# Patient Record
Sex: Female | Born: 1940 | Race: White | Hispanic: No | Marital: Married | Smoking: Never smoker
Health system: Southern US, Community
[De-identification: ages and names within clinical notes are randomized; demographics above are authoritative.]

## PROBLEM LIST (undated history)

## (undated) DIAGNOSIS — E785 Hyperlipidemia, unspecified: Secondary | ICD-10-CM

## (undated) DIAGNOSIS — I10 Essential (primary) hypertension: Secondary | ICD-10-CM

## (undated) DIAGNOSIS — E119 Type 2 diabetes mellitus without complications: Secondary | ICD-10-CM

## (undated) HISTORY — DX: Hyperlipidemia, unspecified: E78.5

## (undated) HISTORY — DX: Essential (primary) hypertension: I10

## (undated) HISTORY — DX: Type 2 diabetes mellitus without complications: E11.9

---

## 2010-08-05 ENCOUNTER — Inpatient Hospital Stay (HOSPITAL_COMMUNITY)
Admission: EM | Admit: 2010-08-05 | Discharge: 2010-08-08 | DRG: 639 | Disposition: A | Discharge status: Home / Self Care | Payer: Medicare Other | Attending: Nephrology | Admitting: Nephrology

## 2010-08-05 ENCOUNTER — Inpatient Hospital Stay (HOSPITAL_COMMUNITY): Payer: Medicare Other

## 2010-08-05 DIAGNOSIS — I10 Essential (primary) hypertension: Secondary | ICD-10-CM | POA: Diagnosis present

## 2010-08-05 DIAGNOSIS — Z833 Family history of diabetes mellitus: Secondary | ICD-10-CM

## 2010-08-05 DIAGNOSIS — E1310 Other specified diabetes mellitus with ketoacidosis without coma: Principal | ICD-10-CM | POA: Diagnosis present

## 2010-08-05 LAB — BASIC METABOLIC PANEL WITH GFR
BUN: 29 mg/dL — ABNORMAL HIGH (ref 6–23)
BUN: 21 mg/dL (ref 6–23)
BUN: 18 mg/dL (ref 6–23)
CO2: 20 meq/L (ref 19–32)
CO2: 27 meq/L (ref 19–32)
CO2: 28 meq/L (ref 19–32)
Calcium: 10.6 mg/dL — ABNORMAL HIGH (ref 8.4–10.5)
Calcium: 9 mg/dL (ref 8.4–10.5)
Calcium: 8.8 mg/dL (ref 8.4–10.5)
Chloride: 84 meq/L — ABNORMAL LOW (ref 96–112)
Chloride: 102 meq/L (ref 96–112)
Chloride: 102 meq/L (ref 96–112)
Creatinine, Ser: 1.27 mg/dL — ABNORMAL HIGH (ref 0.50–1.10)
Creatinine, Ser: 0.88 mg/dL (ref 0.50–1.10)
Creatinine, Ser: 0.84 mg/dL (ref 0.50–1.10)
GFR calc Af Amer: 51 mL/min — ABNORMAL LOW (ref >60)
GFR calc Af Amer: >60 mL/min (ref >60)
GFR calc Af Amer: >60 mL/min (ref >60)
GFR calc non Af Amer: 42 mL/min — ABNORMAL LOW (ref >60)
GFR calc non Af Amer: >60 mL/min (ref >60)
GFR calc non Af Amer: >60 mL/min (ref >60)
Glucose, Bld: 765 mg/dL (ref 70–99)
Glucose, Bld: 253 mg/dL — ABNORMAL HIGH (ref 70–99)
Glucose, Bld: 143 mg/dL — ABNORMAL HIGH (ref 70–99)
Potassium: 4.1 meq/L (ref 3.5–5.1)
Potassium: 3.4 meq/L — ABNORMAL LOW (ref 3.5–5.1)
Potassium: 3.1 meq/L — ABNORMAL LOW (ref 3.5–5.1)
Sodium: 132 meq/L — ABNORMAL LOW (ref 135–145)
Sodium: 140 meq/L (ref 135–145)
Sodium: 139 meq/L (ref 135–145)

## 2010-08-05 LAB — HEPATIC FUNCTION PANEL
ALT: 45 U/L — ABNORMAL HIGH (ref 0–35)
AST: 23 U/L (ref 0–37)
Albumin: 5 g/dL (ref 3.5–5.2)
Alkaline Phosphatase: 129 U/L — ABNORMAL HIGH (ref 39–117)
Bilirubin, Direct: 0.2 mg/dL (ref 0.0–0.3)
Indirect Bilirubin: 0.5 mg/dL (ref 0.3–0.9)
Total Bilirubin: 0.7 mg/dL (ref 0.3–1.2)
Total Protein: 9.7 g/dL — ABNORMAL HIGH (ref 6.0–8.3)

## 2010-08-05 LAB — CBC
HCT: 49.1 % — ABNORMAL HIGH (ref 36.0–46.0)
Hemoglobin: 17.2 g/dL — ABNORMAL HIGH (ref 12.0–15.0)
MCH: 31.4 pg (ref 26.0–34.0)
MCHC: 35 g/dL (ref 30.0–36.0)
MCV: 89.8 fL (ref 78.0–100.0)
Platelets: 207 K/uL (ref 150–400)
RBC: 5.47 MIL/uL — ABNORMAL HIGH (ref 3.87–5.11)
RDW: 12.3 % (ref 11.5–15.5)
WBC: 7.6 K/uL (ref 4.0–10.5)

## 2010-08-05 LAB — GLUCOSE, CAPILLARY
Glucose-Capillary: >600 mg/dL (ref 70–99)
Glucose-Capillary: 368 mg/dL — ABNORMAL HIGH (ref 70–99)
Glucose-Capillary: 256 mg/dL — ABNORMAL HIGH (ref 70–99)
Glucose-Capillary: 324 mg/dL — ABNORMAL HIGH (ref 70–99)
Glucose-Capillary: 301 mg/dL — ABNORMAL HIGH (ref 70–99)
Glucose-Capillary: 180 mg/dL — ABNORMAL HIGH (ref 70–99)
Glucose-Capillary: 326 mg/dL — ABNORMAL HIGH (ref 70–99)

## 2010-08-05 LAB — DIFFERENTIAL
Basophils Absolute: 0 K/uL (ref 0.0–0.1)
Basophils Relative: 0 % (ref 0–1)
Eosinophils Absolute: 0 K/uL (ref 0.0–0.7)
Eosinophils Relative: 0 % (ref 0–5)
Lymphocytes Relative: 15 % (ref 12–46)
Lymphs Abs: 1.2 K/uL (ref 0.7–4.0)
Monocytes Absolute: 0.2 K/uL (ref 0.1–1.0)
Monocytes Relative: 2 % — ABNORMAL LOW (ref 3–12)
Neutro Abs: 6.2 K/uL (ref 1.7–7.7)
Neutrophils Relative %: 82 % — ABNORMAL HIGH (ref 43–77)

## 2010-08-06 LAB — GLUCOSE, CAPILLARY
Glucose-Capillary: 257 mg/dL — ABNORMAL HIGH (ref 70–99)
Glucose-Capillary: 273 mg/dL — ABNORMAL HIGH (ref 70–99)
Glucose-Capillary: 310 mg/dL — ABNORMAL HIGH (ref 70–99)
Glucose-Capillary: 374 mg/dL — ABNORMAL HIGH (ref 70–99)

## 2010-08-06 LAB — BASIC METABOLIC PANEL
BUN: 16 mg/dL (ref 6–23)
CO2: 24 mEq/L (ref 19–32)
CO2: 22 mEq/L (ref 19–32)
CO2: 24 mEq/L (ref 19–32)
Chloride: 102 mEq/L (ref 96–112)
Chloride: 103 mEq/L (ref 96–112)
Chloride: 102 mEq/L (ref 96–112)
Creatinine, Ser: 0.84 mg/dL (ref 0.50–1.10)
Creatinine, Ser: 0.79 mg/dL (ref 0.50–1.10)
GFR calc Af Amer: >60 mL/min (ref >60)
GFR calc Af Amer: >60 mL/min (ref >60)
GFR calc non Af Amer: >60 mL/min (ref >60)
Glucose, Bld: 328 mg/dL — ABNORMAL HIGH (ref 70–99)
Glucose, Bld: 391 mg/dL — ABNORMAL HIGH (ref 70–99)
Potassium: 3.2 mEq/L — ABNORMAL LOW (ref 3.5–5.1)
Potassium: 4 mEq/L (ref 3.5–5.1)
Potassium: 3.9 mEq/L (ref 3.5–5.1)
Potassium: 3.6 mEq/L (ref 3.5–5.1)
Sodium: 136 mEq/L (ref 135–145)
Sodium: 133 mEq/L — ABNORMAL LOW (ref 135–145)
Sodium: 134 mEq/L — ABNORMAL LOW (ref 135–145)

## 2010-08-06 LAB — CBC
HCT: 35 % — ABNORMAL LOW (ref 36.0–46.0)
Hemoglobin: 12.4 g/dL (ref 12.0–15.0)
MCV: 89.3 fL (ref 78.0–100.0)
RBC: 3.92 MIL/uL (ref 3.87–5.11)
WBC: 6.1 10*3/uL (ref 4.0–10.5)

## 2010-08-06 LAB — COMPREHENSIVE METABOLIC PANEL
Alkaline Phosphatase: 74 U/L (ref 39–117)
BUN: 16 mg/dL (ref 6–23)
CO2: 24 mEq/L (ref 19–32)
Chloride: 103 mEq/L (ref 96–112)
Creatinine, Ser: 0.73 mg/dL (ref 0.50–1.10)
GFR calc non Af Amer: >60 mL/min (ref >60)
Glucose, Bld: 292 mg/dL — ABNORMAL HIGH (ref 70–99)
Potassium: 3.7 mEq/L (ref 3.5–5.1)
Total Bilirubin: 0.5 mg/dL (ref 0.3–1.2)

## 2010-08-06 LAB — CLOSTRIDIUM DIFFICILE BY PCR: Toxigenic C. Difficile by PCR: NEGATIVE

## 2010-08-07 LAB — URINE CULTURE
Colony Count: >=100000
Culture  Setup Time: 201206292134

## 2010-08-07 LAB — CBC
HCT: 35.1 % — ABNORMAL LOW (ref 36.0–46.0)
Hemoglobin: 12.3 g/dL (ref 12.0–15.0)
MCH: 31.4 pg (ref 26.0–34.0)
MCHC: 35 g/dL (ref 30.0–36.0)
MCV: 89.5 fL (ref 78.0–100.0)
Platelets: 137 10*3/uL — ABNORMAL LOW (ref 150–400)
RBC: 3.92 MIL/uL (ref 3.87–5.11)
RDW: 12.4 % (ref 11.5–15.5)
WBC: 4.5 10*3/uL (ref 4.0–10.5)

## 2010-08-07 LAB — DIFFERENTIAL
Basophils Absolute: 0 10*3/uL (ref 0.0–0.1)
Basophils Relative: 0 % (ref 0–1)
Eosinophils Absolute: 0.1 10*3/uL (ref 0.0–0.7)
Eosinophils Relative: 2 % (ref 0–5)
Lymphocytes Relative: 54 % — ABNORMAL HIGH (ref 12–46)
Lymphs Abs: 2.4 10*3/uL (ref 0.7–4.0)
Monocytes Absolute: 0.2 10*3/uL (ref 0.1–1.0)
Monocytes Relative: 5 % (ref 3–12)
Neutro Abs: 1.7 10*3/uL (ref 1.7–7.7)
Neutrophils Relative %: 39 % — ABNORMAL LOW (ref 43–77)

## 2010-08-07 LAB — GLUCOSE, CAPILLARY
Glucose-Capillary: 264 mg/dL — ABNORMAL HIGH (ref 70–99)
Glucose-Capillary: 231 mg/dL — ABNORMAL HIGH (ref 70–99)
Glucose-Capillary: 247 mg/dL — ABNORMAL HIGH (ref 70–99)

## 2010-08-07 LAB — BASIC METABOLIC PANEL
BUN: 7 mg/dL (ref 6–23)
CO2: 23 mEq/L (ref 19–32)
Calcium: 8.4 mg/dL (ref 8.4–10.5)
Chloride: 105 mEq/L (ref 96–112)
Creatinine, Ser: 0.6 mg/dL (ref 0.50–1.10)
GFR calc Af Amer: >60 mL/min (ref >60)
GFR calc non Af Amer: >60 mL/min (ref >60)
Glucose, Bld: 264 mg/dL — ABNORMAL HIGH (ref 70–99)
Potassium: 3.2 mEq/L — ABNORMAL LOW (ref 3.5–5.1)
Sodium: 135 mEq/L (ref 135–145)

## 2010-08-08 LAB — FECAL LACTOFERRIN, QUANT: Fecal Lactoferrin: NEGATIVE

## 2010-08-08 LAB — CBC
HCT: 39 % (ref 36.0–46.0)
Hemoglobin: 13.7 g/dL (ref 12.0–15.0)
MCH: 30.9 pg (ref 26.0–34.0)
MCHC: 35.1 g/dL (ref 30.0–36.0)
MCV: 88 fL (ref 78.0–100.0)
Platelets: 153 10*3/uL (ref 150–400)
RBC: 4.43 MIL/uL (ref 3.87–5.11)
RDW: 12.3 % (ref 11.5–15.5)
WBC: 4.7 10*3/uL (ref 4.0–10.5)

## 2010-08-08 LAB — DIFFERENTIAL
Basophils Absolute: 0 10*3/uL (ref 0.0–0.1)
Basophils Relative: 0 % (ref 0–1)
Eosinophils Absolute: 0.1 10*3/uL (ref 0.0–0.7)
Eosinophils Relative: 3 % (ref 0–5)
Lymphocytes Relative: 48 % — ABNORMAL HIGH (ref 12–46)
Lymphs Abs: 2.3 10*3/uL (ref 0.7–4.0)
Monocytes Absolute: 0.3 10*3/uL (ref 0.1–1.0)
Monocytes Relative: 7 % (ref 3–12)
Neutro Abs: 2 10*3/uL (ref 1.7–7.7)
Neutrophils Relative %: 42 % — ABNORMAL LOW (ref 43–77)

## 2010-08-08 LAB — BASIC METABOLIC PANEL
BUN: 9 mg/dL (ref 6–23)
CO2: 23 mEq/L (ref 19–32)
Calcium: 10.1 mg/dL (ref 8.4–10.5)
Chloride: 99 mEq/L (ref 96–112)
Creatinine, Ser: 0.69 mg/dL (ref 0.50–1.10)
GFR calc Af Amer: >60 mL/min (ref >60)
GFR calc non Af Amer: >60 mL/min (ref >60)
Glucose, Bld: 296 mg/dL — ABNORMAL HIGH (ref 70–99)
Potassium: 3.6 mEq/L (ref 3.5–5.1)
Sodium: 132 mEq/L — ABNORMAL LOW (ref 135–145)

## 2010-08-08 LAB — GLUCOSE, CAPILLARY: Glucose-Capillary: 263 mg/dL — ABNORMAL HIGH (ref 70–99)

## 2010-08-10 LAB — OVA AND PARASITE EXAMINATION: Ova and parasites: NONE SEEN

## 2010-08-11 LAB — STOOL CULTURE

## 2010-08-17 NOTE — H&P (Signed)
  NAME:  Tami Villanueva, Tami Villanueva                  ACCOUNT NO.:  [id number]  MEDICAL RECORD NO.:  [id number]  LOCATION:  [facility]                          FACILITY:  [facility]  PHYSICIAN:  Wilson Singer, M.D.DATE OF BIRTH:  [birth year]  DATE OF ADMISSION:  08/05/2010 DATE OF DISCHARGE:  LH                             HISTORY & PHYSICAL   CHIEF COMPLAINT:  Polydipsia, polyuria, and weight loss.  HISTORY OF PRESENT ILLNESS:  This very pleasant 70 year old lady gives a 1-week history of the above symptoms.  She has a family history of diabetes and when she presented to the urgent care, her sugar was in the 500s and was told to come directly to the emergency room.  In the emergency room, her blood glucose is 765.  She has no previous history of diabetes.  She has been feeling lethargic in the last week also.  PAST MEDICAL HISTORY:  Only for hypertension, for which she is on hydrochlorothiazide.  MEDICATIONS:  HCTZ 25 mg daily.  ALLERGIES: 1. BENADRYL. 2. CODEINE. 3. IODINE. 4. PENICILLIN. 5. SULFA.  SOCIAL HISTORY:  She has been married for 12 years.  She does not smoke, does not drink alcohol.  She works as a Occupational hygienist.  FAMILY HISTORY:  Father, she thinks had diabetes, sister who died had diabetes and her son has diabetes.  REVIEW OF SYSTEMS:  Apart from symptoms mentioned above, there are no other symptoms.  All systems are reviewed.  As part of the weight loss goes, she has lost 15 pounds in the last week.  PHYSICAL EXAMINATION:  VITAL SIGNS:  Temperature 97.6, blood pressure 126/69, pulse 90 in sinus rhythm, respiratory rate 16, and saturation 98%. GENERAL:  She looks dehydrated clinically.  She is not pale.  She is not clubbed.  She is not jaundiced. CARDIOVASCULAR:  Heart sounds are present and normal. LUNGS:  Fields are clear. ABDOMEN:  Soft and nontender with no evidence of hepatosplenomegaly. NEUROLOGIC:  She is alert and oriented without any focal neurological signs. SKIN:   There are no obvious skin lesions or rashes.  INVESTIGATIONS:  Sodium 132, potassium 4.1, bicarbonate 20, chloride 84, glucose 765, BUN 29, and creatinine 1.27.  Hemoglobin 17.2, white blood cell count 7.6, and platelets 207.  Lipase normal at 55.  PROBLEM LIST: 1. Diabetic ketoacidosis in newly diagnosed diabetic, likely type 2. 2. Hypertension.  PLAN: 1. Admit to Step-Down Unit. 2. Start intravenous insulin via the Glucommander. 3. Intravenous fluids.  Further recommendations will depend on the patient's hospital progress. I suspect she may still be able to be discharged with oral hypoglycemics rather than insulin.     Wilson Singer, M.D.     NCG/MEDQ  D:  08/05/2010  T:  08/05/2010  Job:  [id number]  cc:   Paulene Floor, M.D.  Electronically Signed by Lilly Cove M.D. on 08/17/2010 04:12:05 PM

## 2010-08-19 NOTE — Discharge Summary (Signed)
  Tami Villanueva, Tami Villanueva                  ACCOUNT NO.:  [id number]  MEDICAL RECORD NO.:  [id number]  LOCATION:  A311                          FACILITY:  [facility]  PHYSICIAN:  Celso Amy, MD   DATE OF BIRTH:  03-17-1940  DATE OF ADMISSION:  08/05/2010 DATE OF DISCHARGE:  07/01/2012LH                              DISCHARGE SUMMARY   PRIMARY CARE:  Dr. Paulene Floor at [facility].  ADMITTING DIAGNOSES: 1. Diabetic ketoacidosis. 2. Newly diagnosed diabetes mellitus type 2. 3. Hypertension.  DISCHARGE DIAGNOSES: 1. Diabetic ketoacidosis, not treated. 2. Uncontrolled diabetes mellitus type 2.  The patient's hemoglobin     A1c of 12.8. 3. Hypertension.  RADIOLOGICAL PROCEDURES DONE DURING THE HOSPITAL STAY:  The patient had chest x-ray done which showed minimal bibasilar atelectasis.   HOSPITAL COURSE AND TREATMENT.  The patient was admitted on August 05, 2010, with chief complaint of polyuria, polydipsia and at the time of admission, the patient was found to be in DKA with the patient's glucose of 765. The patient was started on IV insulin and glucose stabilizer protocol was followed.  The patient's glucose came down.  The patient was switched from IV insulin to long-term subcu insulin and p.o. metformin. The patient's glucoses were   controlled during the rest of the hospital stay.  SOCIAL:  The patient was provided diabetic teaching during the hospital stay.  The patient was provided dietary and calorie count.  The patient was provided the teaching to inject herself.  The patient was provided teaching to check her blood glucose.  DVT:  The patient was kept on DVT prophylaxis.  FLUID ELECTROLYTE NUTRITION:  The patient was hypokalemic which was the K was repleted and the patient's potassium is now normal at the time of discharge.  FOLLOWUP:  The patient is to followup with her primary care in 1 week.  DISCHARGE MEDICATIONS: 1. Metformin 500 mg p.o. daily. 2. Lantus  insulin 15 units subcu daily. 3. Hydrochlorothiazide 25 mg p.o. daily. 4. Dimenhydrinate 50 mg p.o. q.4 h. p.r.n.  The patient is being discharged to home in stable condition.  The patient is to followup with primary care in 1 week.     Celso Amy, MD     MB/MEDQ  D:  08/08/2010  T:  08/09/2010  Job:  [id number]  Electronically Signed by Celso Amy M.D. on 08/19/2010 12:58:18 PM

## 2011-06-29 LAB — HM PAP SMEAR: HM Pap smear: NORMAL

## 2011-07-14 ENCOUNTER — Encounter: Payer: Self-pay | Admitting: Internal Medicine

## 2011-08-26 ENCOUNTER — Encounter: Payer: Medicare Other | Admitting: Internal Medicine

## 2012-06-08 ENCOUNTER — Telehealth: Payer: Self-pay | Admitting: Nurse Practitioner

## 2012-06-08 NOTE — Telephone Encounter (Signed)
appt made

## 2012-06-12 ENCOUNTER — Ambulatory Visit (INDEPENDENT_AMBULATORY_CARE_PROVIDER_SITE_OTHER): Payer: Medicare Other | Admitting: Nurse Practitioner

## 2012-06-12 ENCOUNTER — Encounter: Payer: Self-pay | Admitting: Nurse Practitioner

## 2012-06-12 VITALS — BP 140/84 | HR 74 | Temp 98.2°F | Ht 63.0 in | Wt 168.0 lb

## 2012-06-12 DIAGNOSIS — I10 Essential (primary) hypertension: Secondary | ICD-10-CM

## 2012-06-12 DIAGNOSIS — E119 Type 2 diabetes mellitus without complications: Secondary | ICD-10-CM

## 2012-06-12 DIAGNOSIS — R079 Chest pain, unspecified: Secondary | ICD-10-CM

## 2012-06-12 DIAGNOSIS — E785 Hyperlipidemia, unspecified: Secondary | ICD-10-CM

## 2012-06-12 LAB — COMPLETE METABOLIC PANEL WITH GFR
Albumin: 4.7 g/dL (ref 3.5–5.2)
Alkaline Phosphatase: 61 U/L (ref 39–117)
BUN: 14 mg/dL (ref 6–23)
GFR, Est Non African American: 72 mL/min
Glucose, Bld: 110 mg/dL — ABNORMAL HIGH (ref 70–99)
Potassium: 4.4 mEq/L (ref 3.5–5.3)
Total Bilirubin: 0.7 mg/dL (ref 0.3–1.2)

## 2012-06-12 NOTE — Progress Notes (Signed)
Subjective:    Patient ID: Tami Villanueva, female    DOB: [birth year], 72 y.o.   MRN: [id number]  Hypertension This is a chronic problem. The current episode started more than 1 year ago. The problem is unchanged. The problem is controlled. Pertinent negatives include no blurred vision, headaches, malaise/fatigue, palpitations, peripheral edema or shortness of breath. Chest pain: intermittent. There are no associated agents to hypertension. Risk factors for coronary artery disease include diabetes mellitus, dyslipidemia and post-menopausal state. Past treatments include diuretics. The current treatment provides moderate improvement. Compliance problems include diet and exercise.   Hyperlipidemia This is a chronic problem. The current episode started more than 1 year ago. The problem is controlled. Recent lipid tests were reviewed and are normal. Exacerbating diseases include diabetes. There are no known factors aggravating her hyperlipidemia. Pertinent negatives include no focal weakness, leg pain, myalgias or shortness of breath. Chest pain: intermittent. Current antihyperlipidemic treatment includes statins. The current treatment provides moderate improvement of lipids. Compliance problems include adherence to diet and adherence to exercise.  Risk factors for coronary artery disease include diabetes mellitus, hypertension and post-menopausal.  Diabetes She presents for her follow-up diabetic visit. She has type 2 diabetes mellitus. MedicAlert identification noted. The initial diagnosis of diabetes was made 2 years ago. Her disease course has been stable. Pertinent negatives for hypoglycemia include no headaches. Pertinent negatives for diabetes include no blurred vision, no polydipsia, no polyphagia, no polyuria, no visual change and no weight loss. Chest pain: intermittent. There are no hypoglycemic complications. Symptoms are stable. There are no diabetic complications. Risk factors for coronary artery  disease include dyslipidemia and hypertension. Current diabetic treatment includes oral agent (monotherapy). She is compliant with treatment all of the time. Her weight is stable. She is following a diabetic diet. When asked about meal planning, she reported none. She has not had a previous visit with a dietician. She rarely participates in exercise. There is no change in her home blood glucose trend. Her breakfast blood glucose is taken between 8-9 am. Her breakfast blood glucose range is generally 110-130 mg/dl. Her overall blood glucose range is 110-130 mg/dl. An ACE inhibitor/angiotensin II receptor blocker is not being taken. She does not see a podiatrist.Eye exam is current (08/05/11).      Review of Systems  Constitutional: Negative for weight loss and malaise/fatigue.  Eyes: Negative for blurred vision.  Respiratory: Negative for shortness of breath.   Cardiovascular: Negative for palpitations. Chest pain: intermittent.  Endocrine: Negative for polydipsia, polyphagia and polyuria.  Musculoskeletal: Negative for myalgias.  Neurological: Negative for focal weakness and headaches.  All other systems reviewed and are negative.       Objective:   Physical Exam  Constitutional: She is oriented to person, place, and time. She appears well-developed and well-nourished.  HENT:  Nose: Nose normal.  Mouth/Throat: Oropharynx is clear and moist.  Eyes: EOM are normal.  Neck: Trachea normal, normal range of motion and full passive range of motion without pain. Neck supple. No JVD present. Carotid bruit is not present. No thyromegaly present.  Cardiovascular: Normal rate, regular rhythm, normal heart sounds and intact distal pulses.  Exam reveals no gallop and no friction rub.   No murmur heard. Pulmonary/Chest: Effort normal and breath sounds normal.  Abdominal: Soft. Bowel sounds are normal. She exhibits no distension and no mass. There is no tenderness.  Musculoskeletal: Normal range of  motion.  Lymphadenopathy:    She has no cervical adenopathy.  Neurological: She is alert and  oriented to person, place, and time. She has normal reflexes.  Skin: Skin is warm and dry.  Psychiatric: She has a normal mood and affect. Her behavior is normal. Judgment and thought content normal.   BP 140/84  Pulse 74  Temp(Src) 98.2 F (36.8 C) (Oral)  Ht 5\' 3"  (1.6 m)  Wt 168 lb (76.204 kg)  BMI 29.77 kg/m2       Assessment & Plan:  1. Diabetes Continue to count carbs - POCT glycosylated hemoglobin (Hb A1C) - COMPLETE METABOLIC PANEL WITH GFR - NMR Lipoprofile with Lipids  2. HTN (hypertension) Low Na+ diet - COMPLETE METABOLIC PANEL WITH GFR - NMR Lipoprofile with Lipids  3. Other and unspecified hyperlipidemia Low fat diet and exercise - COMPLETE METABOLIC PANEL WITH GFR - NMR Lipoprofile with Lipids  4. Chest pain Keep record of occurences - EKG 12-Lead  Mary-Margaret Daphine Deutscher, FNP

## 2012-06-12 NOTE — Patient Instructions (Signed)
Diabetes and Exercise  Regular exercise is important and can help:   · Control blood glucose (sugar).  · Decrease blood pressure.  ·   · Control blood lipids (cholesterol, triglycerides).  · Improve overall health.  BENEFITS FROM EXERCISE  · Improved fitness.  · Improved flexibility.  · Improved endurance.  · Increased bone density.  · Weight control.  · Increased muscle strength.  · Decreased body fat.  · Improvement of the body's use of insulin, a hormone.  · Increased insulin sensitivity.  · Reduction of insulin needs.  · Reduced stress and tension.  · Helps you feel better.  People with diabetes who add exercise to their lifestyle gain additional benefits, including:  · Weight loss.  · Reduced appetite.  · Improvement of the body's use of blood glucose.  · Decreased risk factors for heart disease:  · Lowering of cholesterol and triglycerides.  · Raising the level of good cholesterol (high-density lipoproteins, HDL).  · Lowering blood sugar.  · Decreased blood pressure.  TYPE 1 DIABETES AND EXERCISE  · Exercise will usually lower your blood glucose.  · If blood glucose is greater than 240 mg/dl, check urine ketones. If ketones are present, do not exercise.  · Location of the insulin injection sites may need to be adjusted with exercise. Avoid injecting insulin into areas of the body that will be exercised. For example, avoid injecting insulin into:  · The arms when playing tennis.  · The legs when jogging. For more information, discuss this with your caregiver.  · Keep a record of:  · Food intake.  · Type and amount of exercise.  · Expected peak times of insulin action.  · Blood glucose levels.  Do this before, during, and after exercise. Review your records with your caregiver. This will help you to develop guidelines for adjusting food intake and insulin amounts.   TYPE 2 DIABETES AND EXERCISE  · Regular physical activity can help control blood glucose.  · Exercise is important because it may:  · Increase the  body's sensitivity to insulin.  · Improve blood glucose control.  · Exercise reduces the risk of heart disease. It decreases serum cholesterol and triglycerides. It also lowers blood pressure.  · Those who take insulin or oral hypoglycemic agents should watch for signs of hypoglycemia. These signs include dizziness, shaking, sweating, chills, and confusion.  · Body water is lost during exercise. It must be replaced. This will help to avoid loss of body fluids (dehydration) or heat stroke.  Be sure to talk to your caregiver before starting an exercise program to make sure it is safe for you. Remember, any activity is better than none.   Document Released: 04/16/2003 Document Revised: 04/18/2011 Document Reviewed: 07/31/2008  Exit[employer]® Patient Information ©2013 [employer].

## 2012-06-14 LAB — NMR LIPOPROFILE WITH LIPIDS
HDL Size: 9.2 nm (ref >=9.2)
LDL Particle Number: 2753 nmol/L — ABNORMAL HIGH (ref <1000)
Large VLDL-P: 5.5 nmol/L — ABNORMAL HIGH (ref <=2.7)
Triglycerides: 164 mg/dL — ABNORMAL HIGH (ref <150)
VLDL Size: 55.4 nm — ABNORMAL HIGH (ref <=46.6)

## 2012-06-21 ENCOUNTER — Telehealth: Payer: Self-pay | Admitting: *Deleted

## 2012-06-21 MED ORDER — PRAVASTATIN SODIUM 40 MG PO TABS: 40.0000 mg | ORAL_TABLET | Freq: Every day | ORAL | Status: DC

## 2012-06-21 NOTE — Telephone Encounter (Signed)
Pravastatin RX sent to pharmacy

## 2012-06-21 NOTE — Telephone Encounter (Signed)
Message copied by Magdalene River on Thu Jun 21, 2012  1:30 PM ------      Message from: Bennie Pierini      Created: Thu Jun 14, 2012  2:20 PM       LDL PArticle # really high- Make sure take Crestor daily- strict low fat diet and exercise- recheck in 3 months ------

## 2012-06-21 NOTE — Telephone Encounter (Signed)
Pt is not willing to re- try crestor (she said when she tried it she felt funny and nausea) She is willing to try a weaker- generic med - we discussed pravastatin- what do you think?  Mmm to advise

## 2012-06-22 NOTE — Telephone Encounter (Signed)
Pt aware of labs and copy sent to pt 

## 2012-07-12 ENCOUNTER — Other Ambulatory Visit: Payer: Self-pay | Admitting: Nurse Practitioner

## 2012-09-14 ENCOUNTER — Other Ambulatory Visit: Payer: Self-pay | Admitting: Nurse Practitioner

## 2012-10-18 ENCOUNTER — Encounter (HOSPITAL_COMMUNITY): Payer: Self-pay

## 2012-10-18 ENCOUNTER — Emergency Department (HOSPITAL_COMMUNITY): Payer: Worker's Compensation

## 2012-10-18 ENCOUNTER — Emergency Department (HOSPITAL_COMMUNITY)
Admission: EM | Admit: 2012-10-18 | Discharge: 2012-10-18 | Disposition: A | Discharge status: Home / Self Care | Payer: Worker's Compensation | Attending: Emergency Medicine | Admitting: Emergency Medicine

## 2012-10-18 DIAGNOSIS — Y990 Civilian activity done for income or pay: Secondary | ICD-10-CM | POA: Insufficient documentation

## 2012-10-18 DIAGNOSIS — Z79899 Other long term (current) drug therapy: Secondary | ICD-10-CM | POA: Insufficient documentation

## 2012-10-18 DIAGNOSIS — S2239XA Fracture of one rib, unspecified side, initial encounter for closed fracture: Secondary | ICD-10-CM | POA: Insufficient documentation

## 2012-10-18 DIAGNOSIS — S2232XA Fracture of one rib, left side, initial encounter for closed fracture: Secondary | ICD-10-CM

## 2012-10-18 DIAGNOSIS — Y929 Unspecified place or not applicable: Secondary | ICD-10-CM | POA: Insufficient documentation

## 2012-10-18 DIAGNOSIS — W19XXXA Unspecified fall, initial encounter: Secondary | ICD-10-CM | POA: Insufficient documentation

## 2012-10-18 DIAGNOSIS — E785 Hyperlipidemia, unspecified: Secondary | ICD-10-CM | POA: Insufficient documentation

## 2012-10-18 DIAGNOSIS — Z880 Allergy status to penicillin: Secondary | ICD-10-CM | POA: Insufficient documentation

## 2012-10-18 DIAGNOSIS — E119 Type 2 diabetes mellitus without complications: Secondary | ICD-10-CM | POA: Insufficient documentation

## 2012-10-18 DIAGNOSIS — IMO0002 Reserved for concepts with insufficient information to code with codable children: Secondary | ICD-10-CM | POA: Insufficient documentation

## 2012-10-18 DIAGNOSIS — Y939 Activity, unspecified: Secondary | ICD-10-CM | POA: Insufficient documentation

## 2012-10-18 DIAGNOSIS — I10 Essential (primary) hypertension: Secondary | ICD-10-CM | POA: Insufficient documentation

## 2012-10-18 MED ORDER — NAPROXEN 375 MG PO TABS: 375.0000 mg | ORAL_TABLET | Freq: Two times a day (BID) | ORAL | Status: DC

## 2012-10-18 NOTE — ED Provider Notes (Signed)
CSN: [id number]     Arrival date & time 10/18/12  1002 History   First MD Initiated Contact with Patient 10/18/12 1036     Chief Complaint  Patient presents with  . Left Rib Pain    (Consider location/radiation/quality/duration/timing/severity/associated sxs/prior Treatment) Patient is a 72 y.o. female presenting with chest pain. The history is provided by the patient.  Chest Pain Pain location:  L lateral chest Pain quality: aching   Pain radiates to:  Does not radiate Pain radiates to the back: no   Pain severity:  Moderate Onset quality:  Sudden Duration:  2 weeks Timing:  Constant Progression:  Unchanged Chronicity:  New Relieved by:  None tried Worsened by:  Coughing, movement and deep breathing Ineffective treatments:  None tried Associated symptoms: back pain   Associated symptoms: no abdominal pain, no cough, no fever, no headache, no nausea and not vomiting    Tami Villanueva is a 72 y.o. female who presents to the ED with left rib pain. She states that she fell at work on 8/30 and has had pain since then. She has taken nothing for pain. She denies shortness of breath, nausea or vomiting, palpations or other cardiac symptoms. The pain is specific to the area that she hit when she fell.    Past Medical History  Diagnosis Date  . Diabetes mellitus without complication   . Hyperlipidemia   . Hypertension    History reviewed. No pertinent past surgical history. Family History  Problem Relation Age of Onset  . Diabetes Father   . Heart disease Father   . Heart disease Sister    History  Substance Use Topics  . Smoking status: Never Smoker   . Smokeless tobacco: Not on file  . Alcohol Use: No   OB History   Grav Para Term Preterm Abortions TAB SAB Ect Mult Living                 Review of Systems  Constitutional: Negative for fever and chills.  HENT: Negative for neck pain.   Respiratory: Negative for cough, chest tightness and wheezing.   Cardiovascular:  Negative for leg swelling. Chest pain: left rib pain.  Gastrointestinal: Negative for nausea, vomiting and abdominal pain.  Genitourinary: Negative for dysuria and frequency.  Musculoskeletal: Positive for back pain.       Left rib pain  Skin: Negative for wound.  Neurological: Negative for headaches.  Psychiatric/Behavioral: Negative for confusion. The patient is not nervous/anxious.     Allergies  Codeine; Iodine; Penicillins; and Sulfa antibiotics  Home Medications   Current Outpatient Rx  Name  Route  Sig  Dispense  Refill  . metFORMIN (GLUCOPHAGE) 500 MG tablet   Oral   Take 500 mg by mouth 2 (two) times daily with a meal.         . pravastatin (PRAVACHOL) 40 MG tablet   Oral   Take 1 tablet (40 mg total) by mouth daily.   90 tablet   3    BP 147/87  Pulse 90  Temp(Src) 98.4 F (36.9 C) (Oral)  Resp 18  Ht 5\' 3"  (1.6 m)  Wt 160 lb (72.576 kg)  BMI 28.35 kg/m2  SpO2 99% Physical Exam  Nursing note and vitals reviewed. Constitutional: She is oriented to person, place, and time. She appears well-developed and well-nourished. No distress.  HENT:  Head: Normocephalic and atraumatic.  Eyes: Conjunctivae and EOM are normal.  Neck: Normal range of motion. Neck supple.  Cardiovascular: Normal  rate, regular rhythm and normal heart sounds.   Pulmonary/Chest: Effort normal and breath sounds normal. She exhibits tenderness. She exhibits no crepitus and no deformity.    Left anterior rib tenderness with palpation. No ecchymosis noted, no deformity noted.  Abdominal: Soft. There is no tenderness.  Musculoskeletal:  Radial and pedal pulses strong, adequate circulation. Ambulatory without difficulty. Pain with palpation anterior aspect of left ribs.   Neurological: She is alert and oriented to person, place, and time. No cranial nerve deficit.  Skin: Skin is warm and dry.  Psychiatric: She has a normal mood and affect. Her behavior is normal.    Dg Ribs Unilateral  W/chest Left  10/18/2012   CLINICAL DATA:  Fall, left flank pain.  EXAM: LEFT RIBS AND CHEST - 3+ VIEW  COMPARISON:  08/05/2010  FINDINGS: There is a rib fracture involving the antral lateral left 8th rib. No associated pneumothorax or effusion. Heart is normal size. Minimal right base atelectasis.  IMPRESSION: Left 8th rib fracture. No pneumothorax.   Electronically Signed   By: Charlett Nose M.D.   On: 10/18/2012 11:41    ED Course  Procedures  MDM  72 y.o. female with fracture of the 8th rib s/p fall at work. I have reviewed this patient's vital signs, nurses notes, appropriate labs and imaging.  I have discussed findings and plan of care with the patient. She does not want narcotic pain medication. She has taken nothing for pain since the injury 09/16/12. Discussed that NSAIDS may help the pain. Also use of heat to the area. Patient voices understanding. She will follow up with her PCP or return here as needed for any problems.   Medication List    TAKE these medications       naproxen 375 MG tablet  Commonly known as:  NAPROSYN  Take 1 tablet (375 mg total) by mouth 2 (two) times daily.      ASK your doctor about these medications       metFORMIN 500 MG tablet  Commonly known as:  GLUCOPHAGE  Take 500 mg by mouth 2 (two) times daily with a meal.     pravastatin 40 MG tablet  Commonly known as:  PRAVACHOL  Take 1 tablet (40 mg total) by mouth daily.           [address] [place], [place] 10/19/12 [phone]

## 2012-10-18 NOTE — ED Notes (Signed)
Pt reports fell at work on Aug 30 and has had pain in left ribs since.  Pt says put off coming because it was unclear if workman's comp would pay for visit.  Denies any difficulty breathing but says hurts with movment and when lays down on left side.

## 2012-10-19 NOTE — ED Provider Notes (Signed)
Medical screening examination/treatment/procedure(s) were performed by non-physician practitioner and as supervising physician I was immediately available for consultation/collaboration.   Jaylanie Boschee L Kynsley Whitehouse, MD 10/19/12 0929 

## 2012-12-10 ENCOUNTER — Encounter: Payer: Self-pay | Admitting: Nurse Practitioner

## 2012-12-10 ENCOUNTER — Ambulatory Visit (INDEPENDENT_AMBULATORY_CARE_PROVIDER_SITE_OTHER): Payer: Medicare Other | Admitting: Nurse Practitioner

## 2012-12-10 ENCOUNTER — Encounter (INDEPENDENT_AMBULATORY_CARE_PROVIDER_SITE_OTHER): Payer: Self-pay

## 2012-12-10 VITALS — BP 151/87 | HR 79 | Temp 99.1°F | Ht 63.0 in | Wt 170.0 lb

## 2012-12-10 DIAGNOSIS — I10 Essential (primary) hypertension: Secondary | ICD-10-CM

## 2012-12-10 DIAGNOSIS — B079 Viral wart, unspecified: Secondary | ICD-10-CM

## 2012-12-10 DIAGNOSIS — E785 Hyperlipidemia, unspecified: Secondary | ICD-10-CM

## 2012-12-10 DIAGNOSIS — E119 Type 2 diabetes mellitus without complications: Secondary | ICD-10-CM

## 2012-12-10 LAB — POCT GLYCOSYLATED HEMOGLOBIN (HGB A1C): Hemoglobin A1C: 6.6

## 2012-12-10 LAB — POCT UA - MICROALBUMIN: Microalbumin Ur, POC: NEGATIVE mg/L

## 2012-12-10 MED ORDER — METFORMIN HCL 500 MG PO TABS: 500.0000 mg | ORAL_TABLET | Freq: Two times a day (BID) | ORAL | Status: DC

## 2012-12-10 NOTE — Progress Notes (Addendum)
Subjective:    Patient ID: Tami Villanueva, female    DOB: 06-16-1940, 72 y.o.   MRN: [id number]  Hypertension This is a chronic problem. The current episode started more than 1 year ago. The problem is unchanged. The problem is controlled. Pertinent negatives include no blurred vision, headaches, malaise/fatigue, palpitations, peripheral edema or shortness of breath. Chest pain: intermittent. There are no associated agents to hypertension. Risk factors for coronary artery disease include diabetes mellitus, dyslipidemia and post-menopausal state. Past treatments include diuretics. The current treatment provides moderate improvement. Compliance problems include diet and exercise.   Hyperlipidemia This is a chronic problem. The current episode started more than 1 year ago. The problem is controlled. Recent lipid tests were reviewed and are normal. Exacerbating diseases include diabetes. There are no known factors aggravating her hyperlipidemia. Pertinent negatives include no focal weakness, leg pain, myalgias or shortness of breath. Chest pain: intermittent. Current antihyperlipidemic treatment includes statins. The current treatment provides moderate improvement of lipids. Compliance problems include adherence to diet and adherence to exercise.  Risk factors for coronary artery disease include diabetes mellitus, hypertension and post-menopausal.  Diabetes She presents for her follow-up diabetic visit. She has type 2 diabetes mellitus. MedicAlert identification noted. The initial diagnosis of diabetes was made 2 years ago. Her disease course has been stable. Pertinent negatives for hypoglycemia include no headaches. Pertinent negatives for diabetes include no blurred vision, no polydipsia, no polyphagia, no polyuria, no visual change and no weight loss. Chest pain: intermittent. There are no hypoglycemic complications. Symptoms are stable. There are no diabetic complications. Risk factors for coronary artery  disease include dyslipidemia and hypertension. Current diabetic treatment includes oral agent (monotherapy). She is compliant with treatment all of the time. Her weight is stable. She is following a diabetic diet. When asked about meal planning, she reported none. She has not had a previous visit with a dietician. She rarely participates in exercise. There is no change in her home blood glucose trend. Her breakfast blood glucose is taken between 8-9 am. Her breakfast blood glucose range is generally 110-130 mg/dl. Her overall blood glucose range is 110-130 mg/dl. (Patient hasn't checked blood sugar in 6 weeks) An ACE inhibitor/angiotensin II receptor blocker is not being taken. She does not see a podiatrist.Eye exam is current (08/05/11).      Review of Systems  Constitutional: Negative for weight loss and malaise/fatigue.  Eyes: Negative for blurred vision.  Respiratory: Negative for shortness of breath.   Cardiovascular: Negative for palpitations. Chest pain: intermittent.  Endocrine: Negative for polydipsia, polyphagia and polyuria.  Musculoskeletal: Negative for myalgias.  Neurological: Negative for focal weakness and headaches.  All other systems reviewed and are negative.       Objective:   Physical Exam  Constitutional: She is oriented to person, place, and time. She appears well-developed and well-nourished.  HENT:  Nose: Nose normal.  Mouth/Throat: Oropharynx is clear and moist.  Eyes: EOM are normal.  Neck: Trachea normal, normal range of motion and full passive range of motion without pain. Neck supple. No JVD present. Carotid bruit is not present. No thyromegaly present.  Cardiovascular: Normal rate, regular rhythm, normal heart sounds and intact distal pulses.  Exam reveals no gallop and no friction rub.   No murmur heard. Pulmonary/Chest: Effort normal and breath sounds normal.  Abdominal: Soft. Bowel sounds are normal. She exhibits no distension and no mass. There is no  tenderness.  Musculoskeletal: Normal range of motion.  Lymphadenopathy:    She has no  cervical adenopathy.  Neurological: She is alert and oriented to person, place, and time. She has normal reflexes.  Skin: Skin is warm and dry.  Warty fleshed colored skin lesion left shoulder- 1cm  Psychiatric: She has a normal mood and affect. Her behavior is normal. Judgment and thought content normal.   BP 151/87  Pulse 79  Temp(Src) 99.1 F (37.3 C) (Oral)  Ht 5\' 3"  (1.6 m)  Wt 170 lb (77.111 kg)  BMI 30.12 kg/m2  Results for orders placed in visit on 12/10/12  POCT GLYCOSYLATED HEMOGLOBIN (HGB A1C)      Result Value Range   Hemoglobin A1C 6.6     * cryotherapy of warty lesion- patient tolerated well     Assessment & Plan:   1. Hypertension   2. Type II or unspecified type diabetes mellitus without mention of complication, not stated as uncontrolled   3. Hyperlipidemia   4.cryotherapy warty lesion Orders Placed This Encounter  Procedures  . Pneumococcal polysaccharide vaccine 23-valent greater than or equal to 2yo subcutaneous/IM    1 vaccine sub q  . CMP14+EGFR  . NMR, lipoprofile  . POCT glycosylated hemoglobin (Hb A1C)  . POCT UA - Microalbumin   Meds ordered this encounter  Medications  . metFORMIN (GLUCOPHAGE) 500 MG tablet    Sig: Take 1 tablet (500 mg total) by mouth 2 (two) times daily with a meal.    Dispense:  60 tablet    Refill:  5    Order Specific Question:  Supervising Provider    Answer:  Deborra Medina   Cryotherapy care discussed Continue all meds Labs pending Diet and exercise encouraged Health maintenance reviewed Follow up in 3 months  Tami Daphine Deutscher, FNP

## 2012-12-10 NOTE — Patient Instructions (Signed)
Cryotherapy Cryotherapy means treatment with cold. Ice or gel packs can be used to reduce both pain and swelling. Ice is the most helpful within the first 24 to 48 hours after an injury or flareup from overusing a muscle or joint. Sprains, strains, spasms, burning pain, shooting pain, and aches can all be eased with ice. Ice can also be used when recovering from surgery. Ice is effective, has very few side effects, and is safe for most people to use. PRECAUTIONS  Ice is not a safe treatment option for people with:  Raynaud's phenomenon. This is a condition affecting small blood vessels in the extremities. Exposure to cold may cause your problems to return.  Cold hypersensitivity. There are many forms of cold hypersensitivity, including:  Cold urticaria. Red, itchy hives appear on the skin when the tissues begin to warm after being iced.  Cold erythema. This is a red, itchy rash caused by exposure to cold.  Cold hemoglobinuria. Red blood cells break down when the tissues begin to warm after being iced. The hemoglobin that carry oxygen are passed into the urine because they cannot combine with blood proteins fast enough.  Numbness or altered sensitivity in the area being iced. If you have any of the following conditions, do not use ice until you have discussed cryotherapy with your caregiver:  Heart conditions, such as arrhythmia, angina, or chronic heart disease.  High blood pressure.  Healing wounds or open skin in the area being iced.  Current infections.  Rheumatoid arthritis.  Poor circulation.  Diabetes. Ice slows the blood flow in the region it is applied. This is beneficial when trying to stop inflamed tissues from spreading irritating chemicals to surrounding tissues. However, if you expose your skin to cold temperatures for too long or without the proper protection, you can damage your skin or nerves. Watch for signs of skin damage due to cold. HOME CARE INSTRUCTIONS Follow  these tips to use ice and cold packs safely.  Place a dry or damp towel between the ice and skin. A damp towel will cool the skin more quickly, so you may need to shorten the time that the ice is used.  For a more rapid response, add gentle compression to the ice.  Ice for no more than 10 to 20 minutes at a time. The bonier the area you are icing, the less time it will take to get the benefits of ice.  Check your skin after 5 minutes to make sure there are no signs of a poor response to cold or skin damage.  Rest 20 minutes or more in between uses.  Once your skin is numb, you can end your treatment. You can test numbness by very lightly touching your skin. The touch should be so light that you do not see the skin dimple from the pressure of your fingertip. When using ice, most people will feel these normal sensations in this order: cold, burning, aching, and numbness.  Do not use ice on someone who cannot communicate their responses to pain, such as small children or people with dementia. HOW TO MAKE AN ICE PACK Ice packs are the most common way to use ice therapy. Other methods include ice massage, ice baths, and cryo-sprays. Muscle creams that cause a cold, tingly feeling do not offer the same benefits that ice offers and should not be used as a substitute unless recommended by your caregiver. To make an ice pack, do one of the following:  Place crushed ice or   a bag of frozen vegetables in a sealable plastic bag. Squeeze out the excess air. Place this bag inside another plastic bag. Slide the bag into a pillowcase or place a damp towel between your skin and the bag.  Mix 3 parts water with 1 part rubbing alcohol. Freeze the mixture in a sealable plastic bag. When you remove the mixture from the freezer, it will be slushy. Squeeze out the excess air. Place this bag inside another plastic bag. Slide the bag into a pillowcase or place a damp towel between your skin and the bag. SEEK MEDICAL  CARE IF:  You develop white spots on your skin. This may give the skin a blotchy (mottled) appearance.  Your skin turns blue or pale.  Your skin becomes waxy or hard.  Your swelling gets worse. MAKE SURE YOU:   Understand these instructions.  Will watch your condition.  Will get help right away if you are not doing well or get worse. Document Released: 09/20/2010 Document Revised: 04/18/2011 Document Reviewed: 09/20/2010 [employer] Patient Information 2014 [employer].  

## 2012-12-12 ENCOUNTER — Ambulatory Visit: Payer: Medicare Other | Admitting: Nurse Practitioner

## 2012-12-12 LAB — CMP14+EGFR
ALT: 25 IU/L (ref 0–32)
AST: 24 IU/L (ref 0–40)
Albumin/Globulin Ratio: 2 (ref 1.1–2.5)
CO2: 24 mmol/L (ref 18–29)
Calcium: 9.5 mg/dL (ref 8.6–10.2)
Creatinine, Ser: 0.79 mg/dL (ref 0.57–1.00)
GFR calc non Af Amer: 76 mL/min/{1.73_m2} (ref >59)
Globulin, Total: 2.3 g/dL (ref 1.5–4.5)
Glucose: 140 mg/dL — ABNORMAL HIGH (ref 65–99)
Potassium: 4.3 mmol/L (ref 3.5–5.2)
Total Protein: 6.9 g/dL (ref 6.0–8.5)

## 2012-12-12 LAB — NMR, LIPOPROFILE
HDL Cholesterol by NMR: 57 mg/dL (ref >=40)
LDLC SERPL CALC-MCNC: 126 mg/dL — ABNORMAL HIGH (ref <100)
Small LDL Particle Number: 1621 nmol/L — ABNORMAL HIGH (ref <=527)
Triglycerides by NMR: 155 mg/dL — ABNORMAL HIGH (ref <150)

## 2012-12-24 ENCOUNTER — Telehealth: Payer: Self-pay | Admitting: Nurse Practitioner

## 2012-12-24 NOTE — Telephone Encounter (Signed)
Returned call about lab results.

## 2012-12-25 ENCOUNTER — Other Ambulatory Visit: Payer: Self-pay | Admitting: Nurse Practitioner

## 2012-12-25 MED ORDER — ATORVASTATIN CALCIUM 40 MG PO TABS: 40.0000 mg | ORAL_TABLET | Freq: Every day | ORAL | Status: DC

## 2013-01-16 ENCOUNTER — Other Ambulatory Visit: Payer: Self-pay | Admitting: Nurse Practitioner

## 2013-01-16 MED ORDER — ONETOUCH ULTRA 2 W/DEVICE KIT: PACK | Status: DC

## 2013-01-16 MED ORDER — METFORMIN HCL 500 MG PO TABS: 500.0000 mg | ORAL_TABLET | Freq: Two times a day (BID) | ORAL | Status: DC

## 2013-03-03 ENCOUNTER — Other Ambulatory Visit: Payer: Self-pay | Admitting: Nurse Practitioner

## 2013-05-15 ENCOUNTER — Other Ambulatory Visit: Payer: Self-pay | Admitting: Nurse Practitioner

## 2013-05-15 DIAGNOSIS — L989 Disorder of the skin and subcutaneous tissue, unspecified: Secondary | ICD-10-CM

## 2013-08-07 ENCOUNTER — Ambulatory Visit (INDEPENDENT_AMBULATORY_CARE_PROVIDER_SITE_OTHER): Payer: Medicare Other | Admitting: Physician Assistant

## 2013-08-07 ENCOUNTER — Encounter: Payer: Self-pay | Admitting: Physician Assistant

## 2013-08-07 VITALS — BP 130/86 | HR 82 | Temp 97.6°F | Ht 63.0 in | Wt 170.0 lb

## 2013-08-07 DIAGNOSIS — R5383 Other fatigue: Secondary | ICD-10-CM

## 2013-08-07 DIAGNOSIS — E119 Type 2 diabetes mellitus without complications: Secondary | ICD-10-CM

## 2013-08-07 DIAGNOSIS — R5381 Other malaise: Secondary | ICD-10-CM

## 2013-08-07 DIAGNOSIS — H8110 Benign paroxysmal vertigo, unspecified ear: Secondary | ICD-10-CM

## 2013-08-07 LAB — POCT CBC
Granulocyte percent: 59.6 %G (ref 37–80)
HCT, POC: 41.4 % (ref 37.7–47.9)
HEMOGLOBIN: 13.4 g/dL (ref 12.2–16.2)
Lymph, poc: 2.2 (ref 0.6–3.4)
MCH, POC: 29.1 pg (ref 27–31.2)
MCHC: 32.5 g/dL (ref 31.8–35.4)
MCV: 89.5 fL (ref 80–97)
MPV: 7.2 fL (ref 0–99.8)
POC Granulocyte: 3.7 (ref 2–6.9)
POC LYMPH %: 35 % (ref 10–50)
Platelet Count, POC: 207 10*3/uL (ref 142–424)
RBC: 4.6 M/uL (ref 4.04–5.48)
RDW, POC: 12.7 %
WBC: 6.2 10*3/uL (ref 4.6–10.2)

## 2013-08-07 LAB — POCT GLYCOSYLATED HEMOGLOBIN (HGB A1C): Hemoglobin A1C: 7.5

## 2013-08-07 MED ORDER — MECLIZINE HCL 32 MG PO TABS: 32.0000 mg | ORAL_TABLET | Freq: Three times a day (TID) | ORAL | Status: DC | PRN

## 2013-08-07 NOTE — Progress Notes (Signed)
Subjective:     Patient ID: Tami Villanueva, female   DOB: [birth year], 73 y.o.   MRN: [id number]  HPI Pt here with some fatigue and  intemrit dizziness Sx have improved She was concerned that is may be to her BS She has not been following her regular diet and has noticed her BS has been up some Pt went ahead and increased her Glucophage to bid Since she has had some GI upset  Review of Systems  Constitutional: Positive for activity change, appetite change and fatigue. Negative for unexpected weight change.  HENT: Negative.   Respiratory: Negative.   Cardiovascular: Negative.   Gastrointestinal: Positive for nausea, abdominal pain and diarrhea.  Endocrine: Negative.   Genitourinary: Negative.        Objective:   Physical Exam  Nursing note and vitals reviewed. Constitutional: She is oriented to person, place, and time. She appears well-developed and well-nourished.  HENT:  Right Ear: External ear normal.  Left Ear: External ear normal.  Nose: Nose normal.  Mouth/Throat: Oropharynx is clear and moist.  Eyes: EOM are normal. Pupils are equal, round, and reactive to light.  Neck: Neck supple. No JVD present. No thyromegaly present.  Cardiovascular: Normal rate, regular rhythm and normal heart sounds.   Pulmonary/Chest: Effort normal and breath sounds normal.  Lymphadenopathy:    She has no cervical adenopathy.  Neurological: She is alert and oriented to person, place, and time. She has normal reflexes. No cranial nerve deficit. Coordination normal.  A1C- elevated CBC- nl CMP- pending    Assessment:     Fatigue Vertigo NIDDM    Plan:     A1C sub optimal so continue with bid dosing of Glucophage CBC nl Proper diet and exercise Antivert rx prn Fluids Rest Work note Regular f/u regarding NIDDM

## 2013-08-07 NOTE — Patient Instructions (Signed)

## 2013-08-08 LAB — CMP14+EGFR
A/G RATIO: 1.8 (ref 1.1–2.5)
ALK PHOS: 89 IU/L (ref 39–117)
ALT: 26 IU/L (ref 0–32)
AST: 18 IU/L (ref 0–40)
Albumin: 4.7 g/dL (ref 3.5–4.8)
BUN/Creatinine Ratio: 13 (ref 11–26)
BUN: 14 mg/dL (ref 8–27)
CO2: 23 mmol/L (ref 18–29)
CREATININE: 1.05 mg/dL — AB (ref 0.57–1.00)
Calcium: 9.9 mg/dL (ref 8.7–10.3)
Chloride: 96 mmol/L — ABNORMAL LOW (ref 97–108)
GFR calc Af Amer: 62 mL/min/{1.73_m2} (ref >59)
GFR, EST NON AFRICAN AMERICAN: 54 mL/min/{1.73_m2} — AB (ref >59)
GLOBULIN, TOTAL: 2.6 g/dL (ref 1.5–4.5)
Glucose: 158 mg/dL — ABNORMAL HIGH (ref 65–99)
Potassium: 4.7 mmol/L (ref 3.5–5.2)
SODIUM: 138 mmol/L (ref 134–144)
Total Bilirubin: 0.3 mg/dL (ref 0.0–1.2)
Total Protein: 7.3 g/dL (ref 6.0–8.5)

## 2013-08-16 ENCOUNTER — Telehealth: Payer: Self-pay | Admitting: Family Medicine

## 2013-08-16 NOTE — Telephone Encounter (Signed)
Message copied by Waverly Ferrari on Fri Aug 16, 2013  9:24 AM ------      Message from: Chevis Pretty      Created: Thu Aug 15, 2013  3:31 PM       Kidney and liver function stable       ------

## 2013-08-28 LAB — HM DIABETES EYE EXAM

## 2013-09-03 ENCOUNTER — Other Ambulatory Visit: Payer: Self-pay | Admitting: Nurse Practitioner

## 2013-11-20 ENCOUNTER — Encounter: Payer: Self-pay | Admitting: *Deleted

## 2013-12-17 NOTE — Addendum Note (Signed)
Addended by: Wyline Mood on: 12/17/2013 06:39 PM   Modules accepted: Orders

## 2013-12-30 ENCOUNTER — Other Ambulatory Visit: Payer: Medicare Other

## 2013-12-30 DIAGNOSIS — E119 Type 2 diabetes mellitus without complications: Secondary | ICD-10-CM

## 2013-12-30 NOTE — Progress Notes (Signed)
Lab only 

## 2013-12-31 ENCOUNTER — Telehealth: Payer: Self-pay

## 2013-12-31 LAB — MICROALBUMIN, URINE: MICROALBUM., U, RANDOM: 23.5 ug/mL — AB (ref 0.0–17.0)

## 2013-12-31 NOTE — Telephone Encounter (Signed)
Results left on home am as per DPR of 05/2012

## 2013-12-31 NOTE — Telephone Encounter (Signed)
-----   Message from Chipper Herb, MD sent at 12/31/2013  2:23 PM EST ----- The urine microalbumin is slightly elevated. We will continue to monitor this.

## 2014-04-18 ENCOUNTER — Ambulatory Visit (INDEPENDENT_AMBULATORY_CARE_PROVIDER_SITE_OTHER): Payer: Medicare Other | Admitting: Nurse Practitioner

## 2014-04-18 ENCOUNTER — Encounter: Payer: Self-pay | Admitting: Nurse Practitioner

## 2014-04-18 VITALS — BP 158/96 | HR 84 | Temp 97.2°F | Ht 63.0 in | Wt 170.0 lb

## 2014-04-18 DIAGNOSIS — E119 Type 2 diabetes mellitus without complications: Secondary | ICD-10-CM

## 2014-04-18 DIAGNOSIS — Z13820 Encounter for screening for osteoporosis: Secondary | ICD-10-CM

## 2014-04-18 DIAGNOSIS — E785 Hyperlipidemia, unspecified: Secondary | ICD-10-CM | POA: Diagnosis not present

## 2014-04-18 DIAGNOSIS — I10 Essential (primary) hypertension: Secondary | ICD-10-CM | POA: Diagnosis not present

## 2014-04-18 LAB — POCT GLYCOSYLATED HEMOGLOBIN (HGB A1C): Hemoglobin A1C: 8.2

## 2014-04-18 MED ORDER — ATORVASTATIN CALCIUM 40 MG PO TABS: 40.0000 mg | ORAL_TABLET | Freq: Every day | ORAL | Status: DC

## 2014-04-18 MED ORDER — METFORMIN HCL 1000 MG PO TABS: 1000.0000 mg | ORAL_TABLET | Freq: Two times a day (BID) | ORAL | Status: DC

## 2014-04-18 MED ORDER — LISINOPRIL 10 MG PO TABS: 10.0000 mg | ORAL_TABLET | Freq: Every day | ORAL | Status: DC

## 2014-04-18 NOTE — Progress Notes (Signed)
Subjective:    Patient ID: Tami Villanueva, female    DOB: 07-31-1940, 74 y.o.   MRN: [id number]  The patient presents today for follow up of chronic conditions. She complains of waking up with sharp frontal headaches pain 10/10 in the morning for the past 3 weeks, and she reports feeling nauseous and shaky--reported spot check blood sugars have been 197, 179 and 258 at various times through the day. She denies taking any medication for the headaches, states they usually go away by lunch. She reports she had episodes of vomiting and diarrhea 10 days ago which improved in 2 days. She presents denying headaches, nausea, diarrhea, but states she feels fatigued.   Hypertension This is a chronic problem. The current episode started more than 1 year ago. Associated symptoms include malaise/fatigue and peripheral edema (Right is worse than left). Pertinent negatives include no blurred vision, chest pain (intermittent), headaches (None today. ), palpitations or shortness of breath. Risk factors for coronary artery disease include diabetes mellitus, dyslipidemia, post-menopausal state and sedentary lifestyle. Past treatments include nothing. Compliance problems include exercise and diet.   Hyperlipidemia This is a chronic problem. The current episode started more than 1 year ago. Exacerbating diseases include diabetes. Pertinent negatives include no chest pain (intermittent), myalgias or shortness of breath. Current antihyperlipidemic treatment includes statins. Compliance problems include medication side effects.  Risk factors for coronary artery disease include a sedentary lifestyle, obesity, dyslipidemia and diabetes mellitus.  Diabetes She presents for her follow-up diabetic visit. She has type 2 diabetes mellitus. Her disease course has been fluctuating. Pertinent negatives for hypoglycemia include no headaches (None today. ). Associated symptoms include fatigue. Pertinent negatives for diabetes include no blurred  vision, no chest pain (intermittent), no polydipsia, no polyphagia, no polyuria and no visual change. Risk factors for coronary artery disease include diabetes mellitus, dyslipidemia, hypertension, obesity, sedentary lifestyle and post-menopausal. Current diabetic treatment includes oral agent (monotherapy). She is compliant with treatment most of the time. She is following a generally unhealthy diet. She has not had a previous visit with a dietitian. She does not see a podiatrist.Eye exam is current.      Review of Systems  Constitutional: Positive for malaise/fatigue and fatigue. Negative for chills and diaphoresis.  HENT: Negative for hearing loss.   Eyes: Negative for blurred vision and visual disturbance.  Respiratory: Negative for shortness of breath.   Cardiovascular: Positive for leg swelling. Negative for chest pain (intermittent) and palpitations.  Gastrointestinal: Negative for nausea, vomiting and diarrhea.  Endocrine: Negative for polydipsia, polyphagia and polyuria.  Musculoskeletal: Negative for myalgias.  Neurological: Negative for headaches (None today. ).  All other systems reviewed and are negative.      Objective:   Physical Exam  Constitutional: She is oriented to person, place, and time. She appears well-developed and well-nourished.  HENT:  Nose: Nose normal.  Mouth/Throat: Oropharynx is clear and moist.  Eyes: Conjunctivae and EOM are normal. Pupils are equal, round, and reactive to light.  Neck: Trachea normal, normal range of motion and full passive range of motion without pain. Neck supple. No JVD present. Carotid bruit is not present. No thyromegaly present.  Cardiovascular: Normal rate, regular rhythm, normal heart sounds and intact distal pulses.  Exam reveals no gallop and no friction rub.   No murmur heard. Pulmonary/Chest: Effort normal and breath sounds normal.  Abdominal: Soft. Bowel sounds are normal. She exhibits no distension and no mass. There is  no tenderness.  Musculoskeletal: Normal range of motion. She  exhibits edema (Minimal 1+ non pitting edema in lateral right ankle).  Lymphadenopathy:    She has no cervical adenopathy.  Neurological: She is alert and oriented to person, place, and time. She has normal reflexes.  Skin: Skin is warm and dry. No lesion noted.  Psychiatric: She has a normal mood and affect. Her behavior is normal. Judgment and thought content normal.   BP 158/96 mmHg  Pulse 84  Temp(Src) 97.2 F (36.2 C) (Oral)  Ht _0  (1.6 m)  Wt 170 lb (77.111 kg)  BMI 30.12 kg/m2  Results for orders placed or performed in visit on 04/18/14  POCT glycosylated hemoglobin (Hb A1C)  Result Value Ref Range   Hemoglobin A1C 8.2        Assessment & Plan:   1. Type 2 diabetes mellitus without complication Strict low carb diet - POCT glycosylated hemoglobin (Hb A1C) - metFORMIN (GLUCOPHAGE) 1000 MG tablet; Take 1 tablet (1,000 mg total) by mouth 2 (two) times daily with a meal.  Dispense: 180 tablet; Refill: 1  2. Essential hypertension Do not add salt to diet - CMP14+EGFR - lisinopril (PRINIVIL,ZESTRIL) 10 MG tablet; Take 1 tablet (10 mg total) by mouth daily.  Dispense: 90 tablet; Refill: 3  3. Hyperlipidemia Low fat diet - NMR, lipoprofile - atorvastatin (LIPITOR) 40 MG tablet; Take 1 tablet (40 mg total) by mouth daily.  Dispense: 30 tablet; Refill: 5    Labs pending Health maintenance reviewed Diet and exercise encouraged Continue all meds Follow up  In 3 months    [place], FNP

## 2014-04-18 NOTE — Patient Instructions (Signed)

## 2014-04-18 NOTE — Addendum Note (Signed)
Addended by: Chevis Pretty on: 04/18/2014 10:59 AM   Modules accepted: Orders

## 2014-04-19 LAB — NMR, LIPOPROFILE
Cholesterol: 217 mg/dL — ABNORMAL HIGH (ref 100–199)
HDL Cholesterol by NMR: 49 mg/dL
HDL Particle Number: 36.2 umol/L
LDL Particle Number: 2039 nmol/L — ABNORMAL HIGH
LDL Size: 20.7 nm
LDL-C: 130 mg/dL — ABNORMAL HIGH (ref 0–99)
LP-IR Score: 75 — ABNORMAL HIGH
Small LDL Particle Number: 1197 nmol/L — ABNORMAL HIGH
Triglycerides by NMR: 191 mg/dL — ABNORMAL HIGH (ref 0–149)

## 2014-04-19 LAB — CMP14+EGFR
ALT: 45 IU/L — ABNORMAL HIGH (ref 0–32)
AST: 25 IU/L (ref 0–40)
Albumin/Globulin Ratio: 2 (ref 1.1–2.5)
Albumin: 4.6 g/dL (ref 3.5–4.8)
Alkaline Phosphatase: 93 IU/L (ref 39–117)
BUN/Creatinine Ratio: 18 (ref 11–26)
BUN: 14 mg/dL (ref 8–27)
Bilirubin Total: 0.6 mg/dL (ref 0.0–1.2)
CO2: 22 mmol/L (ref 18–29)
Calcium: 9.5 mg/dL (ref 8.7–10.3)
Chloride: 99 mmol/L (ref 97–108)
Creatinine, Ser: 0.79 mg/dL (ref 0.57–1.00)
GFR calc Af Amer: 87 mL/min/1.73
GFR calc non Af Amer: 76 mL/min/1.73
Globulin, Total: 2.3 g/dL (ref 1.5–4.5)
Glucose: 182 mg/dL — ABNORMAL HIGH (ref 65–99)
Potassium: 4.3 mmol/L (ref 3.5–5.2)
Sodium: 138 mmol/L (ref 134–144)
Total Protein: 6.9 g/dL (ref 6.0–8.5)

## 2014-04-25 ENCOUNTER — Other Ambulatory Visit: Payer: Self-pay | Admitting: Nurse Practitioner

## 2014-08-12 ENCOUNTER — Ambulatory Visit: Payer: Medicare Other | Admitting: Nurse Practitioner

## 2014-10-03 ENCOUNTER — Ambulatory Visit (INDEPENDENT_AMBULATORY_CARE_PROVIDER_SITE_OTHER): Payer: Medicare Other | Admitting: Nurse Practitioner

## 2014-10-03 ENCOUNTER — Encounter: Payer: Self-pay | Admitting: Nurse Practitioner

## 2014-10-03 DIAGNOSIS — E119 Type 2 diabetes mellitus without complications: Secondary | ICD-10-CM | POA: Diagnosis not present

## 2014-10-03 DIAGNOSIS — E785 Hyperlipidemia, unspecified: Secondary | ICD-10-CM | POA: Diagnosis not present

## 2014-10-03 DIAGNOSIS — I10 Essential (primary) hypertension: Secondary | ICD-10-CM

## 2014-10-03 LAB — POCT GLYCOSYLATED HEMOGLOBIN (HGB A1C): Hemoglobin A1C: 8.9

## 2014-10-03 NOTE — Patient Instructions (Signed)

## 2014-10-03 NOTE — Progress Notes (Signed)
Subjective:    Patient ID: Tami Villanueva, female    DOB: Mar 29, 1940, 74 y.o.   MRN: [id number]  The patient presents today for follow up of chronic conditions. Patient has stopped taking her meds on a regular basis- says that she only takes when the urge hits her. Say sthat the metformin causes diarrhea.   Hypertension This is a chronic problem. The current episode started more than 1 year ago. Associated symptoms include malaise/fatigue and peripheral edema (Right is worse than left). Pertinent negatives include no blurred vision, chest pain (intermittent), headaches (None today. ), palpitations or shortness of breath. Risk factors for coronary artery disease include diabetes mellitus, dyslipidemia, post-menopausal state and sedentary lifestyle. Past treatments include nothing. Compliance problems include exercise and diet.   Hyperlipidemia This is a chronic problem. The current episode started more than 1 year ago. Exacerbating diseases include diabetes. Pertinent negatives include no chest pain (intermittent), myalgias or shortness of breath. Current antihyperlipidemic treatment includes statins. Compliance problems include medication side effects.  Risk factors for coronary artery disease include a sedentary lifestyle, obesity, dyslipidemia and diabetes mellitus.  Diabetes She presents for her follow-up diabetic visit. She has type 2 diabetes mellitus. Her disease course has been fluctuating. Pertinent negatives for hypoglycemia include no headaches (None today. ). Associated symptoms include fatigue. Pertinent negatives for diabetes include no blurred vision, no chest pain (intermittent), no polydipsia, no polyphagia, no polyuria and no visual change. Risk factors for coronary artery disease include diabetes mellitus, dyslipidemia, hypertension, obesity, sedentary lifestyle and post-menopausal. Current diabetic treatment includes oral agent (monotherapy). She is compliant with treatment most of the  time. She is following a generally unhealthy diet. She has not had a previous visit with a dietitian. She does not see a podiatrist.Eye exam is current.      Review of Systems  Constitutional: Positive for malaise/fatigue and fatigue. Negative for chills and diaphoresis.  HENT: Negative for hearing loss.   Eyes: Negative for blurred vision and visual disturbance.  Respiratory: Negative for shortness of breath.   Cardiovascular: Positive for leg swelling. Negative for chest pain (intermittent) and palpitations.  Gastrointestinal: Negative for nausea, vomiting and diarrhea.  Endocrine: Negative for polydipsia, polyphagia and polyuria.  Musculoskeletal: Negative for myalgias.  Neurological: Negative for headaches (None today. ).  All other systems reviewed and are negative.      Objective:   Physical Exam  Constitutional: She is oriented to person, place, and time. She appears well-developed and well-nourished.  HENT:  Nose: Nose normal.  Mouth/Throat: Oropharynx is clear and moist.  Eyes: Conjunctivae and EOM are normal. Pupils are equal, round, and reactive to light.  Neck: Trachea normal, normal range of motion and full passive range of motion without pain. Neck supple. No JVD present. Carotid bruit is not present. No thyromegaly present.  Cardiovascular: Normal rate, regular rhythm, normal heart sounds and intact distal pulses.  Exam reveals no gallop and no friction rub.   No murmur heard. Pulmonary/Chest: Effort normal and breath sounds normal.  Abdominal: Soft. Bowel sounds are normal. She exhibits no distension and no mass. There is no tenderness.  Musculoskeletal: Normal range of motion. She exhibits edema (Minimal 1+ non pitting edema in lateral right ankle).  Lymphadenopathy:    She has no cervical adenopathy.  Neurological: She is alert and oriented to person, place, and time. She has normal reflexes.  Skin: Skin is warm and dry. No lesion noted.  Psychiatric: She has a  normal mood and affect. Her behavior  is normal. Judgment and thought content normal.   BP 153/85 mmHg  Pulse 87  Temp(Src) 98.9 F (37.2 C) (Oral)  Ht 5' 3"  (1.6 m)  Wt 168 lb 6.4 oz (76.386 kg)  BMI 29.84 kg/m2  Results for orders placed or performed in visit on 10/03/14  POCT glycosylated hemoglobin (Hb A1C)  Result Value Ref Range   Hemoglobin A1C 8.9         Assessment & Plan:   1. Type 2 diabetes mellitus without complication Strict carb counting - POCT glycosylated hemoglobin (Hb A1C) - CMP14+EGFR  2. Hyperlipidemia Low fat diet - CMP14+EGFR - Lipid panel  3. Essential hypertension Do not add salt to diet - CMP14+EGFR - Lipid panel   MUST GO BACK ON ALL MEDS AS RX Reviewed health maintenance Follow up in 3 months   Mary-Margaret Hassell Done, FNP

## 2014-10-04 LAB — CMP14+EGFR
A/G RATIO: 1.6 (ref 1.1–2.5)
ALBUMIN: 4.2 g/dL (ref 3.5–4.8)
ALK PHOS: 86 IU/L (ref 39–117)
ALT: 25 IU/L (ref 0–32)
AST: 21 IU/L (ref 0–40)
BILIRUBIN TOTAL: 0.3 mg/dL (ref 0.0–1.2)
BUN / CREAT RATIO: 18 (ref 11–26)
BUN: 16 mg/dL (ref 8–27)
CHLORIDE: 99 mmol/L (ref 97–108)
CO2: 24 mmol/L (ref 18–29)
Calcium: 9.5 mg/dL (ref 8.7–10.3)
Creatinine, Ser: 0.89 mg/dL (ref 0.57–1.00)
GFR calc Af Amer: 75 mL/min/{1.73_m2} (ref >59)
GFR calc non Af Amer: 65 mL/min/{1.73_m2} (ref >59)
GLOBULIN, TOTAL: 2.6 g/dL (ref 1.5–4.5)
Glucose: 238 mg/dL — ABNORMAL HIGH (ref 65–99)
POTASSIUM: 4.2 mmol/L (ref 3.5–5.2)
Sodium: 140 mmol/L (ref 134–144)
Total Protein: 6.8 g/dL (ref 6.0–8.5)

## 2014-10-04 LAB — LIPID PANEL
CHOLESTEROL TOTAL: 221 mg/dL — AB (ref 100–199)
Chol/HDL Ratio: 4.8 ratio units — ABNORMAL HIGH (ref 0.0–4.4)
HDL: 46 mg/dL (ref >39)
LDL Calculated: 110 mg/dL — ABNORMAL HIGH (ref 0–99)
Triglycerides: 325 mg/dL — ABNORMAL HIGH (ref 0–149)
VLDL Cholesterol Cal: 65 mg/dL — ABNORMAL HIGH (ref 5–40)

## 2014-10-14 ENCOUNTER — Telehealth: Payer: Self-pay | Admitting: Nurse Practitioner

## 2014-10-14 NOTE — Telephone Encounter (Signed)
Patient's husband informed

## 2014-11-25 ENCOUNTER — Encounter: Payer: Self-pay | Admitting: Nurse Practitioner

## 2014-12-01 ENCOUNTER — Other Ambulatory Visit: Payer: Self-pay | Admitting: Nurse Practitioner

## 2014-12-01 DIAGNOSIS — Z1283 Encounter for screening for malignant neoplasm of skin: Secondary | ICD-10-CM

## 2014-12-26 DIAGNOSIS — L570 Actinic keratosis: Secondary | ICD-10-CM | POA: Diagnosis not present

## 2014-12-26 DIAGNOSIS — D485 Neoplasm of uncertain behavior of skin: Secondary | ICD-10-CM | POA: Diagnosis not present

## 2015-01-06 ENCOUNTER — Ambulatory Visit: Payer: Self-pay | Admitting: Nurse Practitioner

## 2015-01-13 ENCOUNTER — Telehealth: Payer: Self-pay | Admitting: Nurse Practitioner

## 2015-01-14 NOTE — Telephone Encounter (Signed)
Pt scheduled apt and it has been put into [employer] notes for [facility]

## 2015-01-16 ENCOUNTER — Ambulatory Visit (INDEPENDENT_AMBULATORY_CARE_PROVIDER_SITE_OTHER): Payer: Medicare Other | Admitting: Nurse Practitioner

## 2015-01-16 ENCOUNTER — Encounter: Payer: Self-pay | Admitting: Nurse Practitioner

## 2015-01-16 VITALS — BP 144/85 | HR 73 | Temp 97.0°F | Ht 63.0 in | Wt 169.0 lb

## 2015-01-16 DIAGNOSIS — I10 Essential (primary) hypertension: Secondary | ICD-10-CM

## 2015-01-16 DIAGNOSIS — Z6829 Body mass index (BMI) 29.0-29.9, adult: Secondary | ICD-10-CM | POA: Diagnosis not present

## 2015-01-16 DIAGNOSIS — Z6830 Body mass index (BMI) 30.0-30.9, adult: Secondary | ICD-10-CM | POA: Insufficient documentation

## 2015-01-16 DIAGNOSIS — E785 Hyperlipidemia, unspecified: Secondary | ICD-10-CM | POA: Diagnosis not present

## 2015-01-16 DIAGNOSIS — E119 Type 2 diabetes mellitus without complications: Secondary | ICD-10-CM

## 2015-01-16 LAB — POCT GLYCOSYLATED HEMOGLOBIN (HGB A1C): Hemoglobin A1C: 8.1

## 2015-01-16 MED ORDER — METFORMIN HCL 1000 MG PO TABS
1000.0000 mg | ORAL_TABLET | Freq: Two times a day (BID) | ORAL | Status: DC
Start: 2015-01-16 — End: 2015-08-18

## 2015-01-16 MED ORDER — ATORVASTATIN CALCIUM 40 MG PO TABS: 40.0000 mg | ORAL_TABLET | Freq: Every day | ORAL | Status: DC

## 2015-01-16 MED ORDER — LISINOPRIL 10 MG PO TABS: 10.0000 mg | ORAL_TABLET | Freq: Every day | ORAL | Status: DC

## 2015-01-16 NOTE — Progress Notes (Signed)
Subjective:    Patient ID: Tami Villanueva, female    DOB: 1941-01-17, 74 y.o.   MRN: [id number]  The patient presents today for follow up of chronic conditions. Last visit her hgba1c was elevated- patient was not taking meds as rx. Has not taken meds today.  Hypertension This is a chronic problem. The current episode started more than 1 year ago. Associated symptoms include malaise/fatigue and peripheral edema (Right is worse than left). Pertinent negatives include no blurred vision, chest pain (intermittent), headaches (None today. ), palpitations or shortness of breath. Risk factors for coronary artery disease include diabetes mellitus, dyslipidemia, post-menopausal state and sedentary lifestyle. Past treatments include nothing. Compliance problems include exercise and diet.   Hyperlipidemia This is a chronic problem. The current episode started more than 1 year ago. Exacerbating diseases include diabetes. Pertinent negatives include no chest pain (intermittent), myalgias or shortness of breath. Current antihyperlipidemic treatment includes statins. Compliance problems include medication side effects.  Risk factors for coronary artery disease include a sedentary lifestyle, obesity, dyslipidemia and diabetes mellitus.  Diabetes She presents for her follow-up diabetic visit. She has type 2 diabetes mellitus. Her disease course has been fluctuating. Pertinent negatives for hypoglycemia include no headaches (None today. ). Associated symptoms include fatigue. Pertinent negatives for diabetes include no blurred vision, no chest pain (intermittent), no polydipsia, no polyphagia, no polyuria and no visual change. Risk factors for coronary artery disease include diabetes mellitus, dyslipidemia, hypertension, obesity, sedentary lifestyle and post-menopausal. Current diabetic treatment includes oral agent (monotherapy). She is compliant with treatment most of the time. She is following a generally unhealthy diet.  She has not had a previous visit with a dietitian. She does not see a podiatrist.Eye exam is current.      Review of Systems  Constitutional: Positive for malaise/fatigue and fatigue. Negative for chills and diaphoresis.  HENT: Negative for hearing loss.   Eyes: Negative for blurred vision and visual disturbance.  Respiratory: Negative for shortness of breath.   Cardiovascular: Positive for leg swelling. Negative for chest pain (intermittent) and palpitations.  Gastrointestinal: Negative for nausea, vomiting and diarrhea.  Endocrine: Negative for polydipsia, polyphagia and polyuria.  Musculoskeletal: Negative for myalgias.  Neurological: Negative for headaches (None today. ).  All other systems reviewed and are negative.      Objective:   Physical Exam  Constitutional: She is oriented to person, place, and time. She appears well-developed and well-nourished.  HENT:  Nose: Nose normal.  Mouth/Throat: Oropharynx is clear and moist.  Eyes: Conjunctivae and EOM are normal. Pupils are equal, round, and reactive to light.  Neck: Trachea normal, normal range of motion and full passive range of motion without pain. Neck supple. No JVD present. Carotid bruit is not present. No thyromegaly present.  Cardiovascular: Normal rate, regular rhythm, normal heart sounds and intact distal pulses.  Exam reveals no gallop and no friction rub.   No murmur heard. Pulmonary/Chest: Effort normal and breath sounds normal.  Abdominal: Soft. Bowel sounds are normal. She exhibits no distension and no mass. There is no tenderness.  Musculoskeletal: Normal range of motion. She exhibits edema (Minimal 1+ non pitting edema in lateral right ankle).  Lymphadenopathy:    She has no cervical adenopathy.  Neurological: She is alert and oriented to person, place, and time. She has normal reflexes.  Skin: Skin is warm and dry. No lesion noted.  Psychiatric: She has a normal mood and affect. Her behavior is normal.  Judgment and thought content normal.  BP 144/85 mmHg  Pulse 73  Temp(Src) 97 F (36.1 C) (Oral)  Ht 5' 3"  (1.6 m)  Wt 169 lb (76.658 kg)  BMI 29.94 kg/m2   Results for orders placed or performed in visit on 01/16/15  POCT glycosylated hemoglobin (Hb A1C)  Result Value Ref Range   Hemoglobin A1C 8.1            Assessment & Plan:  1. Type 2 diabetes mellitus without complication, without long-term current use of insulin (HCC) Please take second dose of metformin - patient not remembering - POCT glycosylated hemoglobin (Hb A1C) - Microalbumin / creatinine urine ratio - metFORMIN (GLUCOPHAGE) 1000 MG tablet; Take 1 tablet (1,000 mg total) by mouth 2 (two) times daily with a meal.  Dispense: 180 tablet; Refill: 1  2. Hyperlipidemia Low fat diet - Lipid panel - atorvastatin (LIPITOR) 40 MG tablet; Take 1 tablet (40 mg total) by mouth daily.  Dispense: 30 tablet; Refill: 5  3. Essential hypertension Do not add salt to diet - CMP14+EGFR - lisinopril (PRINIVIL,ZESTRIL) 10 MG tablet; Take 1 tablet (10 mg total) by mouth daily.  Dispense: 90 tablet; Refill: 3  4. BMI 29.0-29.9,adult Discussed diet and exercise for person with BMI >25 Will recheck weight in 3-6 months     Labs pending Health maintenance reviewed Diet and exercise encouraged Continue all meds Follow up  In 3 month   [place], FNP

## 2015-01-16 NOTE — Addendum Note (Signed)
Addended by: Earlene Plater on: 01/16/2015 04:30 PM   Modules accepted: Orders

## 2015-01-16 NOTE — Patient Instructions (Signed)
Bone Health Bones protect organs, store calcium, and anchor muscles. Good health habits, such as eating nutritious foods and exercising regularly, are important for maintaining healthy bones. They can also help to prevent a condition that causes bones to lose density and become weak and brittle (osteoporosis). WHY IS BONE MASS IMPORTANT? Bone mass refers to the amount of bone tissue that you have. The higher your bone mass, the stronger your bones. An important step toward having healthy bones throughout life is to have strong and dense bones during childhood. A young adult who has a high bone mass is more likely to have a high bone mass later in life. Bone mass at its greatest it is called peak bone mass. A large decline in bone mass occurs in older adults. In women, it occurs about the time of menopause. During this time, it is important to practice good health habits, because if more bone is lost than what is replaced, the bones will become less healthy and more likely to break (fracture). If you find that you have a low bone mass, you may be able to prevent osteoporosis or further bone loss by changing your diet and lifestyle. HOW CAN I FIND OUT IF MY BONE MASS IS LOW? Bone mass can be measured with an X-ray test that is called a bone mineral density (BMD) test. This test is recommended for all women who are age 65 or older. It may also be recommended for men who are age 70 or older, or for people who are more likely to develop osteoporosis due to:  Having bones that break easily.  Having a long-term disease that weakens bones, such as kidney disease or rheumatoid arthritis.  Having menopause earlier than normal.  Taking medicine that weakens bones, such as steroids, thyroid hormones, or hormone treatment for breast cancer or prostate cancer.  Smoking.  Drinking three or more alcoholic drinks each day. WHAT ARE THE NUTRITIONAL RECOMMENDATIONS FOR HEALTHY BONES? To have healthy bones, you need  to get enough of the right minerals and vitamins. Most nutrition experts recommend getting these nutrients from the foods that you eat. Nutritional recommendations vary from person to person. Ask your health care provider what is healthy for you. Here are some general guidelines. Calcium Recommendations Calcium is the most important (essential) mineral for bone health. Most people can get enough calcium from their diet, but supplements may be recommended for people who are at risk for osteoporosis. Good sources of calcium include:  Dairy products, such as low-fat or nonfat milk, cheese, and yogurt.  Dark green leafy vegetables, such as bok choy and broccoli.  Calcium-fortified foods, such as orange juice, cereal, bread, soy beverages, and tofu products.  Nuts, such as almonds. Follow these recommended amounts for daily calcium intake:  Children, age 1-3: 700 mg.  Children, age 4-8: 1,000 mg.  Children, age 9-13: 1,300 mg.  Teens, age 14-18: 1,300 mg.  Adults, age 19-50: 1,000 mg.  Adults, age 51-70:  Men: 1,000 mg.  Women: 1,200 mg.  Adults, age 71 or older: 1,200 mg.  Pregnant and breastfeeding females:  Teens: 1,300 mg.  Adults: 1,000 mg. Vitamin D Recommendations Vitamin D is the most essential vitamin for bone health. It helps the body to absorb calcium. Sunlight stimulates the skin to make vitamin D, so be sure to get enough sunlight. If you live in a cold climate or you do not get outside often, your health care provider may recommend that you take vitamin D supplements. Good   sources of vitamin D in your diet include:  Egg yolks.  Saltwater fish.  Milk and cereal fortified with vitamin D. Follow these recommended amounts for daily vitamin D intake:  Children and teens, age 1-18: 600 international units.  Adults, age 50 or younger: 400-800 international units.  Adults, age 51 or older: 800-1,000 international units. Other Nutrients Other nutrients for bone  health include:  Phosphorus. This mineral is found in meat, poultry, dairy foods, nuts, and legumes. The recommended daily intake for adult men and adult women is 700 mg.  Magnesium. This mineral is found in seeds, nuts, dark green vegetables, and legumes. The recommended daily intake for adult men is 400-420 mg. For adult women, it is 310-320 mg.  Vitamin K. This vitamin is found in green leafy vegetables. The recommended daily intake is 120 mg for adult men and 90 mg for adult women. WHAT TYPE OF PHYSICAL ACTIVITY IS BEST FOR BUILDING AND MAINTAINING HEALTHY BONES? Weight-bearing and strength-building activities are important for building and maintaining peak bone mass. Weight-bearing activities cause muscles and bones to work against gravity. Strength-building activities increases muscle strength that supports bones. Weight-bearing and muscle-building activities include:  Walking and hiking.  Jogging and running.  Dancing.  Gym exercises.  Lifting weights.  Tennis and racquetball.  Climbing stairs.  Aerobics. Adults should get at least 30 minutes of moderate physical activity on most days. Children should get at least 60 minutes of moderate physical activity on most days. Ask your health care provide what type of exercise is best for you. WHERE CAN I FIND MORE INFORMATION? For more information, check out the following websites:  National Osteoporosis Foundation: http://nof.org/learn/basics  National Institutes of Health: http://www.niams.nih.gov/Health_Info/Bone/Bone_Health/bone_health_for_life.asp   This information is not intended to replace advice given to you by your health care provider. Make sure you discuss any questions you have with your health care provider.   Document Released: 04/16/2003 Document Revised: 06/10/2014 Document Reviewed: 01/29/2014 Elsevier Interactive Patient Education 2016 [employer]  

## 2015-01-17 LAB — CMP14+EGFR
ALK PHOS: 78 IU/L (ref 39–117)
ALT: 19 IU/L (ref 0–32)
AST: 18 IU/L (ref 0–40)
Albumin/Globulin Ratio: 1.9 (ref 1.1–2.5)
Albumin: 4.4 g/dL (ref 3.5–4.8)
BUN/Creatinine Ratio: 16 (ref 11–26)
BUN: 12 mg/dL (ref 8–27)
Bilirubin Total: 0.5 mg/dL (ref 0.0–1.2)
CALCIUM: 9.4 mg/dL (ref 8.7–10.3)
CO2: 25 mmol/L (ref 18–29)
Chloride: 100 mmol/L (ref 97–106)
Creatinine, Ser: 0.73 mg/dL (ref 0.57–1.00)
GFR calc Af Amer: 95 mL/min/{1.73_m2} (ref >59)
GFR, EST NON AFRICAN AMERICAN: 83 mL/min/{1.73_m2} (ref >59)
GLOBULIN, TOTAL: 2.3 g/dL (ref 1.5–4.5)
Glucose: 137 mg/dL — ABNORMAL HIGH (ref 65–99)
POTASSIUM: 4.3 mmol/L (ref 3.5–5.2)
Sodium: 140 mmol/L (ref 136–144)
Total Protein: 6.7 g/dL (ref 6.0–8.5)

## 2015-01-17 LAB — LIPID PANEL
Chol/HDL Ratio: 4.7 ratio units — ABNORMAL HIGH (ref 0.0–4.4)
Cholesterol, Total: 242 mg/dL — ABNORMAL HIGH (ref 100–199)
HDL: 51 mg/dL (ref >39)
LDL Calculated: 147 mg/dL — ABNORMAL HIGH (ref 0–99)
TRIGLYCERIDES: 219 mg/dL — AB (ref 0–149)
VLDL Cholesterol Cal: 44 mg/dL — ABNORMAL HIGH (ref 5–40)

## 2015-03-04 ENCOUNTER — Telehealth: Payer: Self-pay | Admitting: Nurse Practitioner

## 2015-04-29 ENCOUNTER — Telehealth: Payer: Self-pay | Admitting: Nurse Practitioner

## 2015-05-02 ENCOUNTER — Encounter: Payer: Self-pay | Admitting: Nurse Practitioner

## 2015-05-02 NOTE — Telephone Encounter (Signed)
Detailed message left for patient that letter is ready to be picked up.

## 2015-05-02 NOTE — Telephone Encounter (Signed)
Letter ready for pick up

## 2015-05-25 ENCOUNTER — Telehealth: Payer: Self-pay | Admitting: Nurse Practitioner

## 2015-05-26 ENCOUNTER — Other Ambulatory Visit: Payer: Self-pay | Admitting: Nurse Practitioner

## 2015-05-28 ENCOUNTER — Other Ambulatory Visit: Payer: Self-pay | Admitting: Nurse Practitioner

## 2015-08-18 ENCOUNTER — Ambulatory Visit (INDEPENDENT_AMBULATORY_CARE_PROVIDER_SITE_OTHER): Payer: Medicare Other | Admitting: Nurse Practitioner

## 2015-08-18 ENCOUNTER — Encounter: Payer: Self-pay | Admitting: Nurse Practitioner

## 2015-08-18 VITALS — BP 148/90 | HR 77 | Temp 97.3°F | Ht 63.0 in | Wt 172.0 lb

## 2015-08-18 DIAGNOSIS — E785 Hyperlipidemia, unspecified: Secondary | ICD-10-CM

## 2015-08-18 DIAGNOSIS — Z0000 Encounter for general adult medical examination without abnormal findings: Secondary | ICD-10-CM | POA: Diagnosis not present

## 2015-08-18 DIAGNOSIS — E119 Type 2 diabetes mellitus without complications: Secondary | ICD-10-CM

## 2015-08-18 DIAGNOSIS — I10 Essential (primary) hypertension: Secondary | ICD-10-CM | POA: Diagnosis not present

## 2015-08-18 DIAGNOSIS — Z01419 Encounter for gynecological examination (general) (routine) without abnormal findings: Secondary | ICD-10-CM

## 2015-08-18 DIAGNOSIS — T161XXA Foreign body in right ear, initial encounter: Secondary | ICD-10-CM

## 2015-08-18 DIAGNOSIS — Z1212 Encounter for screening for malignant neoplasm of rectum: Secondary | ICD-10-CM | POA: Diagnosis not present

## 2015-08-18 DIAGNOSIS — Z23 Encounter for immunization: Secondary | ICD-10-CM | POA: Diagnosis not present

## 2015-08-18 DIAGNOSIS — Z6830 Body mass index (BMI) 30.0-30.9, adult: Secondary | ICD-10-CM

## 2015-08-18 DIAGNOSIS — Z6829 Body mass index (BMI) 29.0-29.9, adult: Secondary | ICD-10-CM

## 2015-08-18 LAB — BAYER DCA HB A1C WAIVED: HB A1C (BAYER DCA - WAIVED): 8.4 % — ABNORMAL HIGH (ref <7.0)

## 2015-08-18 MED ORDER — ATORVASTATIN CALCIUM 40 MG PO TABS: 40.0000 mg | ORAL_TABLET | Freq: Every day | ORAL | Status: DC

## 2015-08-18 MED ORDER — METFORMIN HCL 1000 MG PO TABS: 1000.0000 mg | ORAL_TABLET | Freq: Two times a day (BID) | ORAL | Status: DC

## 2015-08-18 MED ORDER — LISINOPRIL 20 MG PO TABS: 20.0000 mg | ORAL_TABLET | Freq: Every day | ORAL | Status: DC

## 2015-08-18 NOTE — Patient Instructions (Signed)

## 2015-08-18 NOTE — Addendum Note (Signed)
Addended by: Rolena Infante on: 08/18/2015 10:20 AM   Modules accepted: Orders, SmartSet

## 2015-08-18 NOTE — Progress Notes (Signed)
Subjective:    Patient ID: Tami Villanueva, female    DOB: 1940-10-13, 75 y.o.   MRN: [id number]  Patient here today for annual physical exam, PAP and  follow up of chronic medical problems. SHe has not ben seen since December of 2016 and at that time her hgba1c was 8.1%. She is not very compliant with her diet nor does she always tae her meds as rx. She has no complaints today.  Outpatient Encounter Prescriptions as of 08/18/2015  Medication Sig  . atorvastatin (LIPITOR) 40 MG tablet Take 1 tablet (40 mg total) by mouth daily.  . Blood Glucose Monitoring Suppl (ONE TOUCH ULTRA 2) W/DEVICE KIT Dx 250.02  . glucose blood (ONE TOUCH ULTRA TEST) test strip Test qd  . Lancets (ONETOUCH ULTRASOFT) lancets TEST BLOOD SUGAR TWICE DAILY. DX E11.9  . lisinopril (PRINIVIL,ZESTRIL) 10 MG tablet Take 1 tablet (10 mg total) by mouth daily.  . metFORMIN (GLUCOPHAGE) 1000 MG tablet Take 1 tablet (1,000 mg total) by mouth 2 (two) times daily with a meal.   No facility-administered encounter medications on file as of 08/18/2015.     Hypertension This is a chronic problem. The current episode started more than 1 year ago. Associated symptoms include malaise/fatigue and peripheral edema (Right is worse than left). Pertinent negatives include no blurred vision, chest pain (intermittent), headaches (None today. ), palpitations or shortness of breath. Risk factors for coronary artery disease include diabetes mellitus, dyslipidemia, post-menopausal state and sedentary lifestyle. Past treatments include nothing. Compliance problems include exercise and diet.   Hyperlipidemia This is a chronic problem. The current episode started more than 1 year ago. Exacerbating diseases include diabetes. Pertinent negatives include no chest pain (intermittent), myalgias or shortness of breath. Current antihyperlipidemic treatment includes statins. Compliance problems include medication side effects.  Risk factors for coronary artery  disease include a sedentary lifestyle, obesity, dyslipidemia and diabetes mellitus.  Diabetes She presents for her follow-up diabetic visit. She has type 2 diabetes mellitus. Her disease course has been fluctuating. Pertinent negatives for hypoglycemia include no headaches (None today. ). Associated symptoms include fatigue. Pertinent negatives for diabetes include no blurred vision, no chest pain (intermittent), no polydipsia, no polyphagia, no polyuria and no visual change. Risk factors for coronary artery disease include diabetes mellitus, dyslipidemia, hypertension, obesity, sedentary lifestyle and post-menopausal. Current diabetic treatment includes oral agent (monotherapy). She is compliant with treatment most of the time. She is following a generally unhealthy diet. She has not had a previous visit with a dietitian. Home blood sugar record trend: Patient does not check her blod sugars. She does not see a podiatrist.Eye exam is current.      Review of Systems  Constitutional: Positive for malaise/fatigue and fatigue. Negative for chills and diaphoresis.  HENT: Negative for hearing loss.   Eyes: Negative for blurred vision and visual disturbance.  Respiratory: Negative for shortness of breath.   Cardiovascular: Positive for leg swelling. Negative for chest pain (intermittent) and palpitations.  Gastrointestinal: Negative for nausea, vomiting and diarrhea.  Endocrine: Negative for polydipsia, polyphagia and polyuria.  Musculoskeletal: Negative for myalgias.  Neurological: Negative for headaches (None today. ).  All other systems reviewed and are negative.      Objective:   Physical Exam  Constitutional: She is oriented to person, place, and time. She appears well-developed and well-nourished.  HENT:  Head: Normocephalic.  Right Ear: Hearing, tympanic membrane, external ear and ear canal normal.  Left Ear: Hearing, tympanic membrane, external ear and ear canal  normal.  Nose: Nose  normal.  Mouth/Throat: Uvula is midline and oropharynx is clear and moist.  Eyes: Conjunctivae and EOM are normal. Pupils are equal, round, and reactive to light.  Neck: Trachea normal, normal range of motion and full passive range of motion without pain. Neck supple. No JVD present. Carotid bruit is not present. No thyroid mass and no thyromegaly present.  Cardiovascular: Normal rate, regular rhythm, normal heart sounds and intact distal pulses.  Exam reveals no gallop and no friction rub.   No murmur heard. Pulmonary/Chest: Effort normal and breath sounds normal. Right breast exhibits no inverted nipple, no mass, no nipple discharge, no skin change and no tenderness. Left breast exhibits no inverted nipple, no mass, no nipple discharge, no skin change and no tenderness.  Abdominal: Soft. Bowel sounds are normal. She exhibits no distension and no mass. There is no tenderness.  Genitourinary: Uterus normal. No breast swelling, tenderness, discharge or bleeding. Vaginal discharge found.  bimanual exam-No adnexal masses or tenderness. Cervix parous and pink  Musculoskeletal: Normal range of motion. She exhibits edema (Minimal 1+ non pitting edema in lateral right ankle).  Lymphadenopathy:    She has no cervical adenopathy.  Neurological: She is alert and oriented to person, place, and time. She has normal reflexes.  Skin: Skin is warm and dry. No lesion noted.  Psychiatric: She has a normal mood and affect. Her behavior is normal. Judgment and thought content normal.    BP 148/90 mmHg  Pulse 77  Temp(Src) 97.3 F (36.3 C) (Oral)  Ht 5' 3" (1.6 m)  Wt 172 lb (78.019 kg)  BMI 30.48 kg/m2   HGBA1C- 8.4%- unchanged from previous   Removal of cotton tip from right ear canall       Assessment & Plan:  1. Annual physical exam - CBC with Differential/Platelet - Thyroid Panel With TSH - VITAMIN D 25 Hydroxy (Vit-D Deficiency, Fractures)  2. Encounter for routine gynecological  examination - Pap IG (Image Guided)  3. Essential hypertension Do not add salt to diet Changed lisinopril from 32m daily to 268mdaily - CMP14+EGFR  4. Type 2 diabetes mellitus without complication, without long-term current use of insulin (HCC) MUST take metformin BID Strict carb counting - metFORMIN (GLUCOPHAGE) 1000 MG tablet; Take 1 tablet (1,000 mg total) by mouth 2 (two) times daily with a meal.  Dispense: 180 tablet; Refill: 1 - Bayer DCA Hb A1c Waived  5. Hyperlipidemia Low fat diet - atorvastatin (LIPITOR) 40 MG tablet; Take 1 tablet (40 mg total) by mouth daily.  Dispense: 30 tablet; Refill: 5 - Lipid panel  6. BMI 30.0-30.9,adult Discussed diet and exercise for person with BMI >25 Will recheck weight in 3-6 months  7. Screening for malignant neoplasm of the rectum - Fecal occult blood, imunochemical; Future  8. Removal of foreign body right ear -Do not use qtips  In ear  Patient will schedule mammogram and eye exam Labs pending Health maintenance reviewed Diet and exercise encouraged Continue all meds Follow up  In 3 month   MaGramercyFNP

## 2015-08-19 LAB — LIPID PANEL
CHOL/HDL RATIO: 3.5 ratio (ref 0.0–4.4)
Cholesterol, Total: 176 mg/dL (ref 100–199)
HDL: 50 mg/dL (ref >39)
LDL CALC: 95 mg/dL (ref 0–99)
Triglycerides: 153 mg/dL — ABNORMAL HIGH (ref 0–149)
VLDL Cholesterol Cal: 31 mg/dL (ref 5–40)

## 2015-08-19 LAB — CMP14+EGFR
A/G RATIO: 1.7 (ref 1.2–2.2)
ALT: 26 IU/L (ref 0–32)
AST: 23 IU/L (ref 0–40)
Albumin: 4.2 g/dL (ref 3.5–4.8)
Alkaline Phosphatase: 81 IU/L (ref 39–117)
BUN/Creatinine Ratio: 17 (ref 12–28)
BUN: 14 mg/dL (ref 8–27)
Bilirubin Total: 0.4 mg/dL (ref 0.0–1.2)
CALCIUM: 9.3 mg/dL (ref 8.7–10.3)
CO2: 22 mmol/L (ref 18–29)
Chloride: 99 mmol/L (ref 96–106)
Creatinine, Ser: 0.84 mg/dL (ref 0.57–1.00)
GFR, EST AFRICAN AMERICAN: 80 mL/min/{1.73_m2} (ref >59)
GFR, EST NON AFRICAN AMERICAN: 70 mL/min/{1.73_m2} (ref >59)
GLOBULIN, TOTAL: 2.5 g/dL (ref 1.5–4.5)
Glucose: 149 mg/dL — ABNORMAL HIGH (ref 65–99)
POTASSIUM: 4.1 mmol/L (ref 3.5–5.2)
SODIUM: 140 mmol/L (ref 134–144)
TOTAL PROTEIN: 6.7 g/dL (ref 6.0–8.5)

## 2015-08-19 LAB — CBC WITH DIFFERENTIAL/PLATELET
BASOS ABS: 0 10*3/uL (ref 0.0–0.2)
Basos: 0 %
EOS (ABSOLUTE): 0.1 10*3/uL (ref 0.0–0.4)
Eos: 2 %
HEMOGLOBIN: 11.9 g/dL (ref 11.1–15.9)
Hematocrit: 36.7 % (ref 34.0–46.6)
Immature Grans (Abs): 0 10*3/uL (ref 0.0–0.1)
Immature Granulocytes: 0 %
LYMPHS ABS: 1.9 10*3/uL (ref 0.7–3.1)
LYMPHS: 48 %
MCH: 29.3 pg (ref 26.6–33.0)
MCHC: 32.4 g/dL (ref 31.5–35.7)
MCV: 90 fL (ref 79–97)
MONOCYTES: 7 %
Monocytes Absolute: 0.3 10*3/uL (ref 0.1–0.9)
Neutrophils Absolute: 1.7 10*3/uL (ref 1.4–7.0)
Neutrophils: 43 %
Platelets: 177 10*3/uL (ref 150–379)
RBC: 4.06 x10E6/uL (ref 3.77–5.28)
RDW: 14 % (ref 12.3–15.4)
WBC: 4 10*3/uL (ref 3.4–10.8)

## 2015-08-19 LAB — THYROID PANEL WITH TSH
Free Thyroxine Index: 1.5 (ref 1.2–4.9)
T3 Uptake Ratio: 23 % — ABNORMAL LOW (ref 24–39)
T4 TOTAL: 6.5 ug/dL (ref 4.5–12.0)
TSH: 3.66 u[IU]/mL (ref 0.450–4.500)

## 2015-08-19 LAB — VITAMIN D 25 HYDROXY (VIT D DEFICIENCY, FRACTURES): VIT D 25 HYDROXY: 12.2 ng/mL — AB (ref 30.0–100.0)

## 2015-08-20 LAB — PAP IG (IMAGE GUIDED): PAP Smear Comment: 0

## 2015-08-20 NOTE — Progress Notes (Signed)
Verbalize understanding/pt aware

## 2015-09-07 ENCOUNTER — Telehealth: Payer: Self-pay | Admitting: Nurse Practitioner

## 2015-09-08 NOTE — Telephone Encounter (Signed)
Order will be placed.

## 2015-09-09 ENCOUNTER — Other Ambulatory Visit: Payer: Self-pay | Admitting: *Deleted

## 2015-09-09 DIAGNOSIS — Z1231 Encounter for screening mammogram for malignant neoplasm of breast: Secondary | ICD-10-CM

## 2015-09-15 ENCOUNTER — Other Ambulatory Visit: Payer: Self-pay | Admitting: Nurse Practitioner

## 2015-09-15 DIAGNOSIS — Z1231 Encounter for screening mammogram for malignant neoplasm of breast: Secondary | ICD-10-CM

## 2015-09-24 ENCOUNTER — Telehealth: Payer: Self-pay | Admitting: Nurse Practitioner

## 2015-11-02 ENCOUNTER — Encounter: Payer: Medicare Other | Admitting: *Deleted

## 2015-11-24 ENCOUNTER — Encounter: Payer: Self-pay | Admitting: Nurse Practitioner

## 2015-11-24 ENCOUNTER — Ambulatory Visit (INDEPENDENT_AMBULATORY_CARE_PROVIDER_SITE_OTHER): Payer: Medicare Other | Admitting: Nurse Practitioner

## 2015-11-24 VITALS — BP 142/88 | HR 82 | Temp 97.4°F | Ht 63.0 in | Wt 172.0 lb

## 2015-11-24 DIAGNOSIS — E119 Type 2 diabetes mellitus without complications: Secondary | ICD-10-CM | POA: Diagnosis not present

## 2015-11-24 DIAGNOSIS — E785 Hyperlipidemia, unspecified: Secondary | ICD-10-CM

## 2015-11-24 DIAGNOSIS — I10 Essential (primary) hypertension: Secondary | ICD-10-CM

## 2015-11-24 LAB — BAYER DCA HB A1C WAIVED: HB A1C: 8.1 % — AB (ref <7.0)

## 2015-11-24 MED ORDER — SITAGLIPTIN PHOSPHATE 100 MG PO TABS: 100.0000 mg | ORAL_TABLET | Freq: Every day | ORAL | 0 refills | Status: DC

## 2015-11-24 MED ORDER — GLUCOSE BLOOD VI STRP: ORAL_STRIP | 12 refills | Status: DC

## 2015-11-24 NOTE — Progress Notes (Addendum)
Subjective:    Patient ID: Tami Villanueva, female    DOB: [birth year], 75 y.o.   MRN: [id number]  Patient here today for follow up of chronic medical problems. Patient was last seen on 08/18/15. SHe has had no changes since last visit and no complaints.  Outpatient Encounter Prescriptions as of 11/24/2015  Medication Sig  . atorvastatin (LIPITOR) 40 MG tablet Take 1 tablet (40 mg total) by mouth daily.  . Blood Glucose Monitoring Suppl (ONE TOUCH ULTRA 2) W/DEVICE KIT Dx 250.02  . glucose blood (ONE TOUCH ULTRA TEST) test strip Test qd  . Lancets (ONETOUCH ULTRASOFT) lancets TEST BLOOD SUGAR TWICE DAILY. DX E11.9  . lisinopril (PRINIVIL,ZESTRIL) 20 MG tablet Take 1 tablet (20 mg total) by mouth daily.  . metFORMIN (GLUCOPHAGE) 1000 MG tablet Take 1 tablet (1,000 mg total) by mouth 2 (two) times daily with a meal.   No facility-administered encounter medications on file as of 11/24/2015.     Hypertension  This is a chronic problem. The current episode started more than 1 year ago. Associated symptoms include malaise/fatigue and peripheral edema (Right is worse than left). Pertinent negatives include no blurred vision, chest pain (intermittent), headaches (None today. ), palpitations or shortness of breath. Risk factors for coronary artery disease include diabetes mellitus, dyslipidemia, post-menopausal state and sedentary lifestyle. Past treatments include nothing. Compliance problems include exercise and diet.   Hyperlipidemia  This is a chronic problem. The current episode started more than 1 year ago. Exacerbating diseases include diabetes. Pertinent negatives include no chest pain (intermittent), myalgias or shortness of breath. Current antihyperlipidemic treatment includes statins. Compliance problems include medication side effects.  Risk factors for coronary artery disease include a sedentary lifestyle, obesity, dyslipidemia and diabetes mellitus.  Diabetes  She presents for her follow-up  diabetic visit. She has type 2 diabetes mellitus. Her disease course has been fluctuating. Pertinent negatives for hypoglycemia include no headaches (None today. ). Associated symptoms include fatigue. Pertinent negatives for diabetes include no blurred vision, no chest pain (intermittent), no polydipsia, no polyphagia, no polyuria and no visual change. Risk factors for coronary artery disease include diabetes mellitus, dyslipidemia, hypertension, obesity, sedentary lifestyle and post-menopausal. Current diabetic treatment includes oral agent (monotherapy) (patient  says se cannot take metformin in morning because it causes diarrhea and sh ecannot tke it and go to work. she only tkes 1x per day.). She is compliant with treatment most of the time. She is following a generally unhealthy diet. She has not had a previous visit with a dietitian. Home blood sugar record trend: Last HGBA1c was 8.4%- she has not been testing blood sugars at home. Said she lost her glucometer. She does not see a podiatrist.Eye exam is current.      Review of Systems  Constitutional: Positive for fatigue and malaise/fatigue. Negative for chills and diaphoresis.  HENT: Negative for hearing loss.   Eyes: Negative for blurred vision and visual disturbance.  Respiratory: Negative for shortness of breath.   Cardiovascular: Positive for leg swelling. Negative for chest pain (intermittent) and palpitations.  Gastrointestinal: Negative for diarrhea, nausea and vomiting.  Endocrine: Negative for polydipsia, polyphagia and polyuria.  Musculoskeletal: Negative for myalgias.  Neurological: Negative for headaches (None today. ).  All other systems reviewed and are negative.      Objective:   Physical Exam  Constitutional: She is oriented to person, place, and time. She appears well-developed and well-nourished.  HENT:  Nose: Nose normal.  Mouth/Throat: Oropharynx is clear and moist.  Eyes: Conjunctivae and EOM are normal. Pupils  are equal, round, and reactive to light.  Neck: Trachea normal, normal range of motion and full passive range of motion without pain. Neck supple. No JVD present. Carotid bruit is not present. No thyromegaly present.  Cardiovascular: Normal rate, regular rhythm, normal heart sounds and intact distal pulses.  Exam reveals no gallop and no friction rub.   No murmur heard. Pulmonary/Chest: Effort normal and breath sounds normal.  Abdominal: Soft. Bowel sounds are normal. She exhibits no distension and no mass. There is no tenderness.  Musculoskeletal: Normal range of motion. She exhibits edema (Minimal 1+ non pitting edema in lateral right ankle).  Lymphadenopathy:    She has no cervical adenopathy.  Neurological: She is alert and oriented to person, place, and time. She has normal reflexes.  Skin: Skin is warm and dry. No lesion noted.  Psychiatric: She has a normal mood and affect. Her behavior is normal. Judgment and thought content normal.    BP (!) 142/88 (BP Location: Left Arm, Cuff Size: Normal)   Pulse 82   Temp 97.4 F (36.3 C) (Oral)   Ht _0  (1.6 m)   Wt 172 lb (78 kg)   BMI 30.47 kg/m    hgba1c 8.1%    Assessment & Plan:  1. Hyperlipidemia, unspecified hyperlipidemia type Low fat diet - Lipid panel  2. Type 2 diabetes mellitus without complication, without long-term current use of insulin (HCC) Stop metformin due to diarrhea and not taking BID- changed to [place] Keep diary of blood sugars  Recheck Hgfba1c in 1  month - Bayer DCA Hb A1c Waived - Microalbumin / creatinine urine ratio - glucose blood test strip; Test 1x per day and prn  Dispense: 100 each; Refill: 12 - sitaGLIPtin (JANUVIA) 100 MG tablet; Take 1 tablet (100 mg total) by mouth daily.  Dispense: 56 tablet; Refill: 0  3. Essential hypertension Do not add salt to diet - CMP14+EGFR   pateitn will schedule mammogram herself Labs pending Health maintenance reviewed Diet and exercise  encouraged Continue all meds Follow up  In 1 month   [place], FNP

## 2015-11-24 NOTE — Patient Instructions (Signed)

## 2015-11-25 LAB — CMP14+EGFR
ALBUMIN: 4.3 g/dL (ref 3.5–4.8)
ALK PHOS: 82 IU/L (ref 39–117)
ALT: 35 IU/L — ABNORMAL HIGH (ref 0–32)
AST: 31 IU/L (ref 0–40)
Albumin/Globulin Ratio: 1.5 (ref 1.2–2.2)
BILIRUBIN TOTAL: 0.4 mg/dL (ref 0.0–1.2)
BUN / CREAT RATIO: 13 (ref 12–28)
BUN: 10 mg/dL (ref 8–27)
CHLORIDE: 102 mmol/L (ref 96–106)
CO2: 25 mmol/L (ref 18–29)
CREATININE: 0.79 mg/dL (ref 0.57–1.00)
Calcium: 9.6 mg/dL (ref 8.7–10.3)
GFR calc Af Amer: 86 mL/min/{1.73_m2} (ref >59)
GFR calc non Af Amer: 74 mL/min/{1.73_m2} (ref >59)
GLOBULIN, TOTAL: 2.8 g/dL (ref 1.5–4.5)
Glucose: 162 mg/dL — ABNORMAL HIGH (ref 65–99)
POTASSIUM: 4.3 mmol/L (ref 3.5–5.2)
SODIUM: 142 mmol/L (ref 134–144)
Total Protein: 7.1 g/dL (ref 6.0–8.5)

## 2015-11-25 LAB — LIPID PANEL
CHOLESTEROL TOTAL: 236 mg/dL — AB (ref 100–199)
Chol/HDL Ratio: 4.3 ratio units (ref 0.0–4.4)
HDL: 55 mg/dL (ref >39)
LDL Calculated: 138 mg/dL — ABNORMAL HIGH (ref 0–99)
TRIGLYCERIDES: 213 mg/dL — AB (ref 0–149)
VLDL Cholesterol Cal: 43 mg/dL — ABNORMAL HIGH (ref 5–40)

## 2015-11-25 LAB — MICROALBUMIN / CREATININE URINE RATIO
Creatinine, Urine: 94.1 mg/dL
MICROALB/CREAT RATIO: 12.6 mg/g{creat} (ref 0.0–30.0)
Microalbumin, Urine: 11.9 ug/mL

## 2015-12-01 ENCOUNTER — Other Ambulatory Visit: Payer: Self-pay | Admitting: Nurse Practitioner

## 2015-12-01 MED ORDER — ONETOUCH DELICA LANCETS 33G MISC: 3 refills | Status: DC

## 2015-12-01 NOTE — Progress Notes (Signed)
onetouch verio

## 2015-12-14 DIAGNOSIS — Z1231 Encounter for screening mammogram for malignant neoplasm of breast: Secondary | ICD-10-CM | POA: Diagnosis not present

## 2015-12-28 ENCOUNTER — Encounter: Payer: Self-pay | Admitting: Nurse Practitioner

## 2015-12-28 ENCOUNTER — Ambulatory Visit (INDEPENDENT_AMBULATORY_CARE_PROVIDER_SITE_OTHER): Payer: Medicare Other | Admitting: Nurse Practitioner

## 2015-12-28 VITALS — BP 144/84 | HR 77 | Temp 97.7°F | Ht 63.0 in | Wt 172.0 lb

## 2015-12-28 DIAGNOSIS — E119 Type 2 diabetes mellitus without complications: Secondary | ICD-10-CM | POA: Diagnosis not present

## 2015-12-28 LAB — BAYER DCA HB A1C WAIVED: HB A1C (BAYER DCA - WAIVED): 8.5 % — ABNORMAL HIGH (ref <7.0)

## 2015-12-28 MED ORDER — GLIMEPIRIDE 2 MG PO TABS: 2.0000 mg | ORAL_TABLET | Freq: Every day | ORAL | 5 refills | Status: DC

## 2015-12-28 NOTE — Progress Notes (Signed)
   Subjective:    Patient ID: Tami Villanueva, female    DOB: [birth year], 75 y.o.   MRN: [id number]  HPIPatient comes in today for recheck of blood sugars- her last hgba1c was 8.1 . We stopped metformin because of diarrhea and added januvia in its place. She has not been checking blood sugars because she says that her strips do not fit in her meter. Have no idea what blood sugars have  Been running. She says that she has kinda been watching her diet.    Review of Systems  Constitutional: Negative.   HENT: Negative.   Respiratory: Negative.   Cardiovascular: Negative.   Gastrointestinal: Negative.   Genitourinary: Negative.   Neurological: Negative.   Psychiatric/Behavioral: Negative.   All other systems reviewed and are negative.      Objective:   Physical Exam  Constitutional: She is oriented to person, place, and time. She appears well-developed and well-nourished. No distress.  Cardiovascular: Normal rate, regular rhythm and normal heart sounds.   Pulmonary/Chest: Effort normal and breath sounds normal.  Neurological: She is alert and oriented to person, place, and time.  Skin: Skin is warm.  Psychiatric: She has a normal mood and affect. Her behavior is normal. Judgment and thought content normal.    BP (!) 144/84 (BP Location: Left Arm, Cuff Size: Normal)   Pulse 77   Temp 97.7 F (36.5 C) (Oral)   Ht 5\' 3"  (1.6 m)   Wt 172 lb (78 kg)   BMI 30.47 kg/m   hgba1c-8.5%- up from 8.1%    Assessment & Plan:   1. Type 2 diabetes mellitus without complication, without long-term current use of insulin ([place])    Meds ordered this encounter  Medications  . glimepiride (AMARYL) 2 MG tablet    Sig: Take 1 tablet (2 mg total) by mouth daily with breakfast.    Dispense:  30 tablet    Refill:  5    Order Specific Question:   Supervising Provider    Answer:   Eustaquio Maize [[id number]]   Continue januvia as rx Follow up in 1  Month Carb counting encouraged Will let me know which  test strips to order by calling and letting me know which meter she has.  Mary-Margaret Hassell Done, FNP

## 2015-12-28 NOTE — Patient Instructions (Signed)
Carbohydrate Counting for Diabetes Mellitus, Adult Carbohydrate counting is a method for keeping track of how many carbohydrates you eat. Eating carbohydrates naturally increases the amount of sugar (glucose) in the blood. Counting how many carbohydrates you eat helps keep your blood glucose within normal limits, which helps you manage your diabetes (diabetes mellitus). It is important to know how many carbohydrates you can safely have in each meal. This is different for every person. A diet and nutrition specialist (registered dietitian) can help you make a meal plan and calculate how many carbohydrates you should have at each meal and snack. Carbohydrates are found in the following foods:  Grains, such as breads and cereals.  Dried beans and soy products.  Starchy vegetables, such as potatoes, peas, and corn.  Fruit and fruit juices.  Milk and yogurt.  Sweets and snack foods, such as cake, cookies, candy, chips, and soft drinks. How do I count carbohydrates? There are two ways to count carbohydrates in food. You can use either of the methods or a combination of both. Reading "Nutrition Facts" on packaged food  The "Nutrition Facts" list is included on the labels of almost all packaged foods and beverages in the [place] It includes:  The serving size.  Information about nutrients in each serving, including the grams (g) of carbohydrate per serving. To use the "Nutrition Facts":  Decide how many servings you will have.  Multiply the number of servings by the number of carbohydrates per serving.  The resulting number is the total amount of carbohydrates that you will be having. Learning standard serving sizes of other foods  When you eat foods containing carbohydrates that are not packaged or do not include "Nutrition Facts" on the label, you need to measure the servings in order to count the amount of carbohydrates:  Measure the foods that you will eat with a food scale or measuring  cup, if needed.  Decide how many standard-size servings you will eat.  Multiply the number of servings by 15. Most carbohydrate-rich foods have about 15 g of carbohydrates per serving.  For example, if you eat 8 oz (170 g) of strawberries, you will have eaten 2 servings and 30 g of carbohydrates (2 servings x 15 g = 30 g).  For foods that have more than one food mixed, such as soups and casseroles, you must count the carbohydrates in each food that is included. The following list contains standard serving sizes of common carbohydrate-rich foods. Each of these servings has about 15 g of carbohydrates:   hamburger bun or  English muffin.   oz (15 mL) syrup.   oz (14 g) jelly.  1 slice of bread.  1 six-inch tortilla.  3 oz (85 g) cooked rice or pasta.  4 oz (113 g) cooked dried beans.  4 oz (113 g) starchy vegetable, such as peas, corn, or potatoes.  4 oz (113 g) hot cereal.  4 oz (113 g) mashed potatoes or  of a large baked potato.  4 oz (113 g) canned or frozen fruit.  4 oz (120 mL) fruit juice.  4-6 crackers.  6 chicken nuggets.  6 oz (170 g) unsweetened dry cereal.  6 oz (170 g) plain fat-free yogurt or yogurt sweetened with artificial sweeteners.  8 oz (240 mL) milk.  8 oz (170 g) fresh fruit or one small piece of fruit.  24 oz (680 g) popped popcorn. Example of carbohydrate counting Sample meal  3 oz (85 g) chicken breast.  6 oz (  170 g) brown rice.  4 oz (113 g) corn.  8 oz (240 mL) milk.  8 oz (170 g) strawberries with sugar-free whipped topping. Carbohydrate calculation 1. Identify the foods that contain carbohydrates:  Rice.  Corn.  Milk.  Strawberries. 2. Calculate how many servings you have of each food:  2 servings rice.  1 serving corn.  1 serving milk.  1 serving strawberries. 3. Multiply each number of servings by 15 g:  2 servings rice x 15 g = 30 g.  1 serving corn x 15 g = 15 g.  1 serving milk x 15 g = 15  g.  1 serving strawberries x 15 g = 15 g. 4. Add together all of the amounts to find the total grams of carbohydrates eaten:  30 g + 15 g + 15 g + 15 g = 75 g of carbohydrates total. This information is not intended to replace advice given to you by your health care provider. Make sure you discuss any questions you have with your health care provider. Document Released: 01/24/2005 Document Revised: 08/14/2015 Document Reviewed: 07/08/2015 Elsevier Interactive Patient Education  2017 Elsevier Inc.  

## 2016-01-13 ENCOUNTER — Other Ambulatory Visit: Payer: Self-pay | Admitting: Nurse Practitioner

## 2016-01-13 MED ORDER — GLUCOSE BLOOD VI STRP: ORAL_STRIP | 12 refills | Status: DC

## 2016-01-13 MED ORDER — ONETOUCH ULTRASOFT LANCETS MISC: 12 refills | Status: DC

## 2016-06-13 ENCOUNTER — Ambulatory Visit (INDEPENDENT_AMBULATORY_CARE_PROVIDER_SITE_OTHER): Payer: Medicare Other | Admitting: Pediatrics

## 2016-06-13 ENCOUNTER — Encounter: Payer: Self-pay | Admitting: Pediatrics

## 2016-06-13 VITALS — BP 151/96 | HR 91 | Temp 98.6°F | Ht 63.0 in | Wt 166.0 lb

## 2016-06-13 DIAGNOSIS — I10 Essential (primary) hypertension: Secondary | ICD-10-CM

## 2016-06-13 DIAGNOSIS — E119 Type 2 diabetes mellitus without complications: Secondary | ICD-10-CM | POA: Diagnosis not present

## 2016-06-13 LAB — BAYER DCA HB A1C WAIVED: HB A1C: 10 % — AB (ref <7.0)

## 2016-06-13 MED ORDER — INSULIN DETEMIR 100 UNIT/ML FLEXPEN: 15.0000 [IU] | PEN_INJECTOR | Freq: Every day | SUBCUTANEOUS | 11 refills | Status: DC

## 2016-06-13 NOTE — Progress Notes (Signed)
  Subjective:   Patient ID: Tami Villanueva, female    DOB: [birth year], 76 y.o.   MRN: [id number] CC: Hyperglycemia (422 Saturday, 304 This Am)  HPI: Tami Villanueva is a 76 y.o. female presenting for Hyperglycemia (422 Saturday, 304 This Am)  DM2: Over 200 for 2-3weeks Two days ago up to 422 Had URI last week Stayed in bed Taking meds regularly Avoiding sugar, eating sandwiches with one slice of bread Metformin '1000mg'$  BID for past two days Started on januvia but was too expensive so didn't fill after samples ran out  HTN: taking meds daily No SOB, no CP  Relevant past medical, surgical, family and social history reviewed. Allergies and medications reviewed and updated. History  Smoking Status  . Never Smoker  Smokeless Tobacco  . Never Used   ROS: Per HPI   Objective:    BP (!) 151/96   Pulse 91   Temp 98.6 F (37 C) (Oral)   Ht '5\' 3"'$  (1.6 m)   Wt 166 lb (75.3 kg)   BMI 29.41 kg/m   Wt Readings from Last 3 Encounters:  06/13/16 166 lb (75.3 kg)  12/28/15 172 lb (78 kg)  11/24/15 172 lb (78 kg)    Gen: NAD, alert, well appearing, cooperative with exam, NCAT EYES: EOMI, no conjunctival injection, or no icterus ENT:  TMs pearly gray b/l, OP without erythema LYMPH: no cervical LAD CV: NRRR, normal S1/S2, no murmur, distal pulses 2+ b/l Resp: CTABL, no wheezes, normal WOB Abd: +BS, soft, NTND. no guarding or organomegaly Ext: No edema, warm Neuro: Alert and oriented  Assessment & Plan:  [name] was seen today for hyperglycemia.  Diagnoses and all orders for this visit:  Type 2 diabetes mellitus without complication, without long-term current use of insulin (HCC) A1c 10 Intolerant of metformin Has been on insulin in the past Will start levemir, avoid sugar intake Check BGLs every morning, if over 200 let us know -     Bayer DCA Hb A1c Waived -     BMP8+EGFR -     Insulin Detemir (LEVEMIR) 100 UNIT/ML Pen; Inject 15 Units into the skin daily at 10 pm.  Essential  hypertension Elevated today, worried about sugar levels asymptomatic Check at home If remains elevated let me know  Follow up plan: Return in about 4 weeks (around 07/11/2016). Assunta Found, MD [place]

## 2016-06-14 LAB — BMP8+EGFR
BUN/Creatinine Ratio: 17 (ref 12–28)
BUN: 16 mg/dL (ref 8–27)
CALCIUM: 9.7 mg/dL (ref 8.7–10.3)
CHLORIDE: 94 mmol/L — AB (ref 96–106)
CO2: 18 mmol/L (ref 18–29)
Creatinine, Ser: 0.92 mg/dL (ref 0.57–1.00)
GFR, EST AFRICAN AMERICAN: 71 mL/min/{1.73_m2} (ref >59)
GFR, EST NON AFRICAN AMERICAN: 62 mL/min/{1.73_m2} (ref >59)
Glucose: 299 mg/dL — ABNORMAL HIGH (ref 65–99)
Potassium: 4.2 mmol/L (ref 3.5–5.2)
Sodium: 136 mmol/L (ref 134–144)

## 2016-07-08 ENCOUNTER — Ambulatory Visit: Payer: Self-pay | Admitting: Nurse Practitioner

## 2016-09-23 DIAGNOSIS — H5202 Hypermetropia, left eye: Secondary | ICD-10-CM | POA: Diagnosis not present

## 2016-09-23 DIAGNOSIS — H52223 Regular astigmatism, bilateral: Secondary | ICD-10-CM | POA: Diagnosis not present

## 2016-09-23 DIAGNOSIS — H524 Presbyopia: Secondary | ICD-10-CM | POA: Diagnosis not present

## 2016-09-23 DIAGNOSIS — H5211 Myopia, right eye: Secondary | ICD-10-CM | POA: Diagnosis not present

## 2016-09-23 LAB — HM DIABETES EYE EXAM

## 2016-11-25 ENCOUNTER — Other Ambulatory Visit: Payer: Self-pay | Admitting: Nurse Practitioner

## 2016-11-25 DIAGNOSIS — E119 Type 2 diabetes mellitus without complications: Secondary | ICD-10-CM

## 2016-12-14 ENCOUNTER — Other Ambulatory Visit: Payer: Self-pay | Admitting: Nurse Practitioner

## 2016-12-16 DIAGNOSIS — Z1231 Encounter for screening mammogram for malignant neoplasm of breast: Secondary | ICD-10-CM | POA: Diagnosis not present

## 2017-01-05 ENCOUNTER — Ambulatory Visit (INDEPENDENT_AMBULATORY_CARE_PROVIDER_SITE_OTHER): Payer: Medicare Other | Admitting: Nurse Practitioner

## 2017-01-05 ENCOUNTER — Encounter: Payer: Self-pay | Admitting: Nurse Practitioner

## 2017-01-05 VITALS — BP 150/88 | HR 90 | Temp 97.0°F | Ht 63.0 in | Wt 169.8 lb

## 2017-01-05 DIAGNOSIS — Z0000 Encounter for general adult medical examination without abnormal findings: Secondary | ICD-10-CM

## 2017-01-05 DIAGNOSIS — E119 Type 2 diabetes mellitus without complications: Secondary | ICD-10-CM

## 2017-01-05 DIAGNOSIS — E785 Hyperlipidemia, unspecified: Secondary | ICD-10-CM | POA: Diagnosis not present

## 2017-01-05 DIAGNOSIS — I10 Essential (primary) hypertension: Secondary | ICD-10-CM | POA: Diagnosis not present

## 2017-01-05 DIAGNOSIS — Z6830 Body mass index (BMI) 30.0-30.9, adult: Secondary | ICD-10-CM

## 2017-01-05 DIAGNOSIS — L989 Disorder of the skin and subcutaneous tissue, unspecified: Secondary | ICD-10-CM

## 2017-01-05 LAB — BAYER DCA HB A1C WAIVED: HB A1C (BAYER DCA - WAIVED): 6.8 % (ref <7.0)

## 2017-01-05 MED ORDER — INSULIN GLARGINE 100 UNIT/ML SOLOSTAR PEN: 15.0000 [IU] | PEN_INJECTOR | Freq: Every day | SUBCUTANEOUS | 99 refills | Status: DC

## 2017-01-05 MED ORDER — METFORMIN HCL 1000 MG PO TABS: 1000.0000 mg | ORAL_TABLET | Freq: Two times a day (BID) | ORAL | 0 refills | Status: DC

## 2017-01-05 NOTE — Progress Notes (Signed)
Subjective:    Patient ID: Tami Villanueva, female    DOB: [birth year], 76 y.o.   MRN: [id number]  HPI   Tami Villanueva is here today for follow up of chronic medical problem.  Outpatient Encounter Medications as of 01/05/2017  Medication Sig  . atorvastatin (LIPITOR) 40 MG tablet Take 1 tablet (40 mg total) by mouth daily.  . Blood Glucose Monitoring Suppl (ONE TOUCH ULTRA 2) W/DEVICE KIT Dx 250.02  . glucose blood (ONE TOUCH ULTRA TEST) test strip Test qd  . glucose blood (ONE TOUCH ULTRA TEST) test strip Test 1 x pr day and prn  . Insulin Detemir (LEVEMIR) 100 UNIT/ML Pen Inject 15 Units into the skin daily at 10 pm.  . Lancets (ONETOUCH ULTRASOFT) lancets Test 1x per day and prn  . lisinopril (PRINIVIL,ZESTRIL) 20 MG tablet Take 1 tablet (20 mg total) by mouth daily.  . metFORMIN (GLUCOPHAGE) 1000 MG tablet TAKE 1 TABLET (1,000 MG TOTAL) BY MOUTH 2 (TWO) TIMES DAILY WITH A MEAL.  Marland Kitchen ONETOUCH DELICA LANCETS 21F MISC 1 daily and prn to check blood sugar   No facility-administered encounter medications on file as of 01/05/2017.     1. Annual physical exam  No papa had last year  2. Type 2 diabetes mellitus without complication, without long-term current use of insulin ([place]) Patient saw DR. Evette Doffing in May 2018 and her hgba1c was 10.0%. She was started on levemir 15u daily. She was suppose to keep diary of blood sugars every morning. Patient has not been checking blood sugars anymore. She was suppose to follow up in 4 weeks but did not because she says she was feeling fine.  3. Essential hypertension  No c/o chest pain, sob or headache. Does not check blood pressures at home. Patient has not been taking lisinopril. BP Readings from Last 3 Encounters:  06/13/16 (!) 151/96  12/28/15 (!) 144/84  11/24/15 (!) 142/88     4. Hyperlipidemia, unspecified hyperlipidemia type  Does not watch diet at all  5. BMI 30.0-30.9,adult  No recent weight changes    New complaints: None today Wants to  see dermatology for skin lesions.  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  HENT: Negative.   Eyes: Negative for pain.  Respiratory: Negative for shortness of breath.   Cardiovascular: Negative for chest pain, palpitations and leg swelling.  Gastrointestinal: Negative for abdominal pain.  Endocrine: Negative for polydipsia.  Genitourinary: Negative.   Skin: Negative for rash.  Neurological: Negative for dizziness, weakness and headaches.  Hematological: Does not bruise/bleed easily.  Psychiatric/Behavioral: Negative.   All other systems reviewed and are negative.      Objective:   Physical Exam  Constitutional: She is oriented to person, place, and time. She appears well-developed and well-nourished.  HENT:  Nose: Nose normal.  Mouth/Throat: Oropharynx is clear and moist.  Eyes: EOM are normal.  Neck: Trachea normal, normal range of motion and full passive range of motion without pain. Neck supple. No JVD present. Carotid bruit is not present. No thyromegaly present.  Cardiovascular: Normal rate, regular rhythm, normal heart sounds and intact distal pulses. Exam reveals no gallop and no friction rub.  No murmur heard. Pulmonary/Chest: Effort normal and breath sounds normal.  Abdominal: Soft. Bowel sounds are normal. She exhibits no distension and no mass. There is no tenderness.  Musculoskeletal: Normal range of motion.  Lymphadenopathy:    She has no cervical adenopathy.  Neurological: She is alert and oriented to person, place, and time. She has normal reflexes.  Skin: Skin is warm and dry.  Psychiatric: She has a normal mood and affect. Her behavior is normal. Judgment and thought content normal.   BP (!) 150/88   Pulse 90   Temp (!) 97 F (36.1 C) (Oral)   Ht _0  (1.6 m)   Wt 169 lb 12.8 oz (77 kg)   BMI 30.08 kg/m   hgba1c 6.8%       Assessment & Plan:  1. Annual physical exam - CBC with Differential/Platelet - Thyroid Panel With TSH  2. Type 2 diabetes mellitus without complication, without long-term current use of insulin (HCC) Changed form levimir to lantus - Bayer DCA Hb A1c Waived - Microalbumin / creatinine urine ratio - metFORMIN (GLUCOPHAGE) 1000 MG tablet; Take 1 tablet (1,000 mg total) by mouth 2 (two) times daily with a meal.  Dispense: 180 tablet; Refill: 0 - Insulin Glargine (LANTUS SOLOSTAR) 100 UNIT/ML Solostar Pen; Inject 15 Units into the skin daily at 10 pm.  Dispense: 5 pen; Refill: PRN  3. Essential hypertension Low sodium diet - CMP14+EGFR  4. Hyperlipidemia, unspecified hyperlipidemia type Low fat diet - Lipid panel  5. BMI 30.0-30.9,adult Discussed diet and exercise for person with BMI >25 Will recheck weight in 3-6 months  6. Skin lesions - Ambulatory referral to Dermatology    Labs pending Health maintenance reviewed Diet and exercise encouraged Continue all meds Follow up  In 3 months   [place], FNP

## 2017-01-05 NOTE — Patient Instructions (Signed)

## 2017-01-06 LAB — THYROID PANEL WITH TSH
Free Thyroxine Index: 1.3 (ref 1.2–4.9)
T3 Uptake Ratio: 22 % — ABNORMAL LOW (ref 24–39)
T4, Total: 6 ug/dL (ref 4.5–12.0)
TSH: 2.48 u[IU]/mL (ref 0.450–4.500)

## 2017-01-06 LAB — LIPID PANEL
CHOL/HDL RATIO: 4.1 ratio (ref 0.0–4.4)
Cholesterol, Total: 236 mg/dL — ABNORMAL HIGH (ref 100–199)
HDL: 57 mg/dL (ref >39)
LDL CALC: 155 mg/dL — AB (ref 0–99)
Triglycerides: 118 mg/dL (ref 0–149)
VLDL CHOLESTEROL CAL: 24 mg/dL (ref 5–40)

## 2017-01-06 LAB — CBC WITH DIFFERENTIAL/PLATELET
BASOS: 1 %
Basophils Absolute: 0 10*3/uL (ref 0.0–0.2)
EOS (ABSOLUTE): 0.1 10*3/uL (ref 0.0–0.4)
EOS: 1 %
HEMATOCRIT: 36.3 % (ref 34.0–46.6)
Hemoglobin: 12.2 g/dL (ref 11.1–15.9)
Immature Grans (Abs): 0 10*3/uL (ref 0.0–0.1)
Immature Granulocytes: 0 %
LYMPHS: 32 %
Lymphocytes Absolute: 1.9 10*3/uL (ref 0.7–3.1)
MCH: 29.8 pg (ref 26.6–33.0)
MCHC: 33.6 g/dL (ref 31.5–35.7)
MCV: 89 fL (ref 79–97)
Monocytes Absolute: 0.3 10*3/uL (ref 0.1–0.9)
Monocytes: 4 %
NEUTROS ABS: 3.8 10*3/uL (ref 1.4–7.0)
Neutrophils: 62 %
PLATELETS: 213 10*3/uL (ref 150–379)
RBC: 4.1 x10E6/uL (ref 3.77–5.28)
RDW: 14.2 % (ref 12.3–15.4)
WBC: 6.1 10*3/uL (ref 3.4–10.8)

## 2017-01-06 LAB — CMP14+EGFR
ALT: 31 IU/L (ref 0–32)
AST: 40 IU/L (ref 0–40)
Albumin/Globulin Ratio: 1.7 (ref 1.2–2.2)
Albumin: 4.5 g/dL (ref 3.5–4.8)
Alkaline Phosphatase: 72 IU/L (ref 39–117)
BUN/Creatinine Ratio: 15 (ref 12–28)
BUN: 12 mg/dL (ref 8–27)
Bilirubin Total: 0.4 mg/dL (ref 0.0–1.2)
CALCIUM: 9.2 mg/dL (ref 8.7–10.3)
CO2: 23 mmol/L (ref 20–29)
CREATININE: 0.79 mg/dL (ref 0.57–1.00)
Chloride: 101 mmol/L (ref 96–106)
GFR, EST AFRICAN AMERICAN: 85 mL/min/{1.73_m2} (ref >59)
GFR, EST NON AFRICAN AMERICAN: 74 mL/min/{1.73_m2} (ref >59)
GLOBULIN, TOTAL: 2.6 g/dL (ref 1.5–4.5)
Glucose: 112 mg/dL — ABNORMAL HIGH (ref 65–99)
POTASSIUM: 4.3 mmol/L (ref 3.5–5.2)
SODIUM: 140 mmol/L (ref 134–144)
TOTAL PROTEIN: 7.1 g/dL (ref 6.0–8.5)

## 2017-01-06 LAB — MICROALBUMIN / CREATININE URINE RATIO
Creatinine, Urine: 51.6 mg/dL
Microalb/Creat Ratio: <5.8 mg/g creat (ref 0.0–30.0)
Microalbumin, Urine: <3 ug/mL

## 2017-01-11 MED ORDER — SIMVASTATIN 40 MG PO TABS: 40.0000 mg | ORAL_TABLET | Freq: Every day | ORAL | 3 refills | Status: DC

## 2017-01-11 NOTE — Addendum Note (Signed)
Addended by: Chevis Pretty on: 01/11/2017 09:48 AM   Modules accepted: Orders

## 2017-02-24 DIAGNOSIS — L570 Actinic keratosis: Secondary | ICD-10-CM | POA: Diagnosis not present

## 2017-02-24 DIAGNOSIS — D229 Melanocytic nevi, unspecified: Secondary | ICD-10-CM | POA: Diagnosis not present

## 2017-02-24 DIAGNOSIS — L820 Inflamed seborrheic keratosis: Secondary | ICD-10-CM | POA: Diagnosis not present

## 2017-04-28 ENCOUNTER — Other Ambulatory Visit: Payer: Medicare Other

## 2017-04-28 ENCOUNTER — Other Ambulatory Visit: Payer: Self-pay | Admitting: Nurse Practitioner

## 2017-04-28 ENCOUNTER — Ambulatory Visit: Payer: Medicare Other | Admitting: Nurse Practitioner

## 2017-04-28 DIAGNOSIS — R300 Dysuria: Secondary | ICD-10-CM

## 2017-04-28 DIAGNOSIS — N3001 Acute cystitis with hematuria: Secondary | ICD-10-CM

## 2017-04-28 LAB — URINALYSIS, COMPLETE
Bilirubin, UA: NEGATIVE
Bilirubin, UA: NEGATIVE
GLUCOSE, UA: NEGATIVE
Glucose, UA: NEGATIVE
Ketones, UA: NEGATIVE
Ketones, UA: NEGATIVE
NITRITE UA: NEGATIVE
Nitrite, UA: NEGATIVE
Specific Gravity, UA: >1.03 — ABNORMAL HIGH (ref 1.005–1.030)
Specific Gravity, UA: >1.03 — ABNORMAL HIGH (ref 1.005–1.030)
UUROB: 0.2 mg/dL (ref 0.2–1.0)
Urobilinogen, Ur: 0.2 mg/dL (ref 0.2–1.0)
pH, UA: 6 (ref 5.0–7.5)
pH, UA: 6 (ref 5.0–7.5)

## 2017-04-28 LAB — MICROSCOPIC EXAMINATION: WBC, UA: >30 /hpf — ABNORMAL HIGH (ref 0–5)

## 2017-04-28 MED ORDER — CIPROFLOXACIN HCL 500 MG PO TABS: 500.0000 mg | ORAL_TABLET | Freq: Two times a day (BID) | ORAL | 0 refills | Status: DC

## 2017-05-01 LAB — URINE CULTURE

## 2017-05-02 ENCOUNTER — Encounter: Payer: Self-pay | Admitting: Nurse Practitioner

## 2017-05-12 ENCOUNTER — Encounter: Payer: Self-pay | Admitting: Nurse Practitioner

## 2017-05-12 ENCOUNTER — Ambulatory Visit (INDEPENDENT_AMBULATORY_CARE_PROVIDER_SITE_OTHER): Payer: Medicare Other | Admitting: Nurse Practitioner

## 2017-05-12 VITALS — BP 162/88 | HR 97 | Temp 97.2°F | Ht 63.0 in | Wt 171.0 lb

## 2017-05-12 DIAGNOSIS — Z124 Encounter for screening for malignant neoplasm of cervix: Secondary | ICD-10-CM

## 2017-05-12 DIAGNOSIS — N950 Postmenopausal bleeding: Secondary | ICD-10-CM

## 2017-05-12 DIAGNOSIS — E119 Type 2 diabetes mellitus without complications: Secondary | ICD-10-CM | POA: Diagnosis not present

## 2017-05-12 DIAGNOSIS — Z6830 Body mass index (BMI) 30.0-30.9, adult: Secondary | ICD-10-CM

## 2017-05-12 DIAGNOSIS — E785 Hyperlipidemia, unspecified: Secondary | ICD-10-CM | POA: Diagnosis not present

## 2017-05-12 DIAGNOSIS — Z0000 Encounter for general adult medical examination without abnormal findings: Secondary | ICD-10-CM | POA: Diagnosis not present

## 2017-05-12 DIAGNOSIS — I10 Essential (primary) hypertension: Secondary | ICD-10-CM

## 2017-05-12 LAB — MICROSCOPIC EXAMINATION
RBC, UA: NONE SEEN /hpf (ref 0–2)
Renal Epithel, UA: NONE SEEN /hpf

## 2017-05-12 LAB — CBC WITH DIFFERENTIAL/PLATELET
BASOS ABS: 0 10*3/uL (ref 0.0–0.2)
Basos: 0 %
EOS (ABSOLUTE): 0.1 10*3/uL (ref 0.0–0.4)
Eos: 2 %
HEMOGLOBIN: 12.3 g/dL (ref 11.1–15.9)
Hematocrit: 37.8 % (ref 34.0–46.6)
Immature Grans (Abs): 0 10*3/uL (ref 0.0–0.1)
Immature Granulocytes: 0 %
LYMPHS ABS: 1.4 10*3/uL (ref 0.7–3.1)
Lymphs: 32 %
MCH: 28.8 pg (ref 26.6–33.0)
MCHC: 32.5 g/dL (ref 31.5–35.7)
MCV: 89 fL (ref 79–97)
MONOCYTES: 7 %
MONOS ABS: 0.3 10*3/uL (ref 0.1–0.9)
NEUTROS ABS: 2.5 10*3/uL (ref 1.4–7.0)
Neutrophils: 59 %
PLATELETS: 219 10*3/uL (ref 150–379)
RBC: 4.27 x10E6/uL (ref 3.77–5.28)
RDW: 14 % (ref 12.3–15.4)
WBC: 4.3 10*3/uL (ref 3.4–10.8)

## 2017-05-12 LAB — URINALYSIS, COMPLETE
BILIRUBIN UA: NEGATIVE
Glucose, UA: NEGATIVE
KETONES UA: NEGATIVE
Nitrite, UA: NEGATIVE
PH UA: 5.5 (ref 5.0–7.5)
Protein, UA: NEGATIVE
RBC UA: NEGATIVE
SPEC GRAV UA: 1.02 (ref 1.005–1.030)
UUROB: 0.2 mg/dL (ref 0.2–1.0)

## 2017-05-12 LAB — BAYER DCA HB A1C WAIVED: HB A1C (BAYER DCA - WAIVED): 7.9 % — ABNORMAL HIGH (ref <7.0)

## 2017-05-12 MED ORDER — METFORMIN HCL 1000 MG PO TABS
1000.0000 mg | ORAL_TABLET | Freq: Two times a day (BID) | ORAL | 1 refills | Status: DC
Start: 2017-05-12 — End: 2017-10-04

## 2017-05-12 MED ORDER — SIMVASTATIN 40 MG PO TABS: 40.0000 mg | ORAL_TABLET | Freq: Every day | ORAL | 1 refills | Status: DC

## 2017-05-12 MED ORDER — LISINOPRIL 20 MG PO TABS: 20.0000 mg | ORAL_TABLET | Freq: Every day | ORAL | 3 refills | Status: DC

## 2017-05-12 MED ORDER — INSULIN GLARGINE 100 UNIT/ML SOLOSTAR PEN: 20.0000 [IU] | PEN_INJECTOR | Freq: Every day | SUBCUTANEOUS | 99 refills | Status: DC

## 2017-05-12 NOTE — Patient Instructions (Signed)
Postmenopausal Bleeding Postmenopausal bleeding is any bleeding after menopause. Menopause is when a woman's period stops. Any type of bleeding after menopause is concerning. It should be checked by your doctor. Any treatment will depend on the cause. Follow these instructions at home: Watch your condition for any changes.  Avoid the use of tampons and douches as told by your doctor.  Change your pads often.  Get regular pelvic exams and Pap tests.  Keep all appointments for tests as told by your doctor.  Contact a doctor if:  Your bleeding lasts for more than 1 week.  You have belly (abdominal) pain.  You have bleeding after sex (intercourse). Get help right away if:  You have a fever, chills, a headache, dizziness, muscle aches, and bleeding.  You have strong pain with bleeding.  You have clumps of blood (blood clots) coming from your vagina.  You have bleeding and need more than 1 pad an hour.  You feel like you are going to pass out (faint). This information is not intended to replace advice given to you by your health care provider. Make sure you discuss any questions you have with your health care provider. Document Released: 11/03/2007 Document Revised: 07/02/2015 Document Reviewed: 08/23/2012 Elsevier Interactive Patient Education  2017 Elsevier Inc.  

## 2017-05-12 NOTE — Progress Notes (Signed)
Subjective:    Patient ID: Tami Villanueva, female    DOB: 04/24/1940, 77 y.o.   MRN: [id number]  HPI Tami Villanueva is here today for follow up of chronic medical problem.  Outpatient Encounter Medications as of 05/12/2017  Medication Sig  . Blood Glucose Monitoring Suppl (ONE TOUCH ULTRA 2) W/DEVICE KIT Dx 250.02  . glucose blood (ONE TOUCH ULTRA TEST) test strip Test 1 x pr day and prn  . Insulin Glargine (LANTUS SOLOSTAR) 100 UNIT/ML Solostar Pen Inject 15 Units into the skin daily at 10 pm.  . Lancets (ONETOUCH ULTRASOFT) lancets Test 1x per day and prn  . metFORMIN (GLUCOPHAGE) 1000 MG tablet Take 1 tablet (1,000 mg total) by mouth 2 (two) times daily with a meal.  . ONETOUCH DELICA LANCETS 83G MISC 1 daily and prn to check blood sugar  . simvastatin (ZOCOR) 40 MG tablet Take 1 tablet (40 mg total) by mouth at bedtime.     1. Type 2 diabetes mellitus without complication, without long-term current use of insulin ([name]) last HGBA1c was 6.8%. She says that blood sugars are around 120-130 when she checks it. She does not check it everyday.  2. Hyperlipidemia, unspecified hyperlipidemia type  Not watch diet at all.  3. Essential hypertension  No c/o chest pain, sob r headache. She does not check blood pressures at home BP Readings from Last 3 Encounters:  05/12/17 (!) 162/88  01/05/17 (!) 150/88  06/13/16 (!) 151/96   .  4. Pap smear for cervical cancer screening  She is hear today for Pap and really needs endometrial bx. She had a week long episode of bleeding last month and she has been menopausal for over 30 years. She denies any abdominal pain or cramping.  5. 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   Review of Systems  Constitutional: Negative for activity change and appetite change.  HENT: Negative.   Eyes: Negative for pain.  Respiratory: Negative for  shortness of breath.   Cardiovascular: Negative for chest pain, palpitations and leg swelling.  Gastrointestinal: Negative for abdominal pain.  Endocrine: Negative for polydipsia.  Genitourinary: Positive for vaginal bleeding (for 7 day sa month ago).  Skin: Negative for rash.  Neurological: Negative for dizziness, weakness and headaches.  Hematological: Does not bruise/bleed easily.  Psychiatric/Behavioral: Negative.   All other systems reviewed and are negative.      Objective:   Physical Exam  Constitutional: She is oriented to person, place, and time. She appears well-developed and well-nourished.  HENT:  Head: Normocephalic.  Right Ear: Hearing, tympanic membrane, external ear and ear canal normal.  Left Ear: Hearing, tympanic membrane, external ear and ear canal normal.  Nose: Nose normal.  Mouth/Throat: Uvula is midline and oropharynx is clear and moist.  Eyes: Pupils are equal, round, and reactive to light. Conjunctivae and EOM are normal.  Neck: Trachea normal, normal range of motion and full passive range of motion without pain. Neck supple. No JVD present. Carotid bruit is not present. No thyroid mass and no thyromegaly present.  Cardiovascular: Normal rate, regular rhythm, normal heart sounds and intact distal pulses. Exam reveals no gallop and no friction rub.  No murmur heard. Pulmonary/Chest: Effort normal and breath sounds normal. Right breast exhibits no inverted nipple, no mass, no nipple discharge, no skin change and no tenderness. Left breast exhibits no inverted nipple,  no mass, no nipple discharge, no skin change and no tenderness. No breast swelling, tenderness, discharge or bleeding.  Abdominal: Soft. Bowel sounds are normal. She exhibits no distension and no mass. There is no tenderness.  Genitourinary: Vagina normal and uterus normal.  Genitourinary Comments: bimanual exam-No adnexal masses or tenderness. Cervical stenosis Endometrial bx attempted with no  success  Musculoskeletal: Normal range of motion.  Lymphadenopathy:    She has no cervical adenopathy.  Neurological: She is alert and oriented to person, place, and time. She has normal reflexes.  Skin: Skin is warm and dry.  Psychiatric: She has a normal mood and affect. Her behavior is normal. Judgment and thought content normal.   BP (!) 162/88   Pulse 97   Temp (!) 97.2 F (36.2 C) (Oral)   Ht 5' 3" (1.6 m)   Wt 171 lb (77.6 kg)   BMI 30.29 kg/m        Assessment & Plan:  1. Type 2 diabetes mellitus without complication, without long-term current use of insulin (HCC) Stricter carb counting Increased lantus to 20u daily - Bayer DCA Hb A1c Waived - Insulin Glargine (LANTUS SOLOSTAR) 100 UNIT/ML Solostar Pen; Inject 20 Units into the skin daily at 10 pm.  Dispense: 5 pen; Refill: PRN - metFORMIN (GLUCOPHAGE) 1000 MG tablet; Take 1 tablet (1,000 mg total) by mouth 2 (two) times daily with a meal.  Dispense: 180 tablet; Refill: 1  2. Hyperlipidemia, unspecified hyperlipidemia type Low fta diet - Lipid panel  3. Essential hypertension Low sodium diet Added lisinopril to meds - CMP14+EGFR - lisinopril (PRINIVIL,ZESTRIL) 20 MG tablet; Take 1 tablet (20 mg total) by mouth daily.  Dispense: 90 tablet; Refill: 3  4. Pap smear for cervical cancer screening - Urinalysis, Complete  5. BMI 30.0-30.9,adult Discussed diet and exercise for person with BMI >25 Will recheck weight in 3-6 months  6. Post-menopausal bleeding Unable to do endometrial bx. Patient does not want referral. Told if nay more spotting occurs will need referral to gyn for bx - Pap IG (Image Guided)  7. Annual physical exam - Thyroid Panel With TSH - CBC with Differential/Platelet    Labs pending Health maintenance reviewed Diet and exercise encouraged Continue all meds Follow up  In 3 months   Mary-Margaret Martin, FNP   

## 2017-05-13 LAB — LIPID PANEL
CHOL/HDL RATIO: 3.4 ratio (ref 0.0–4.4)
Cholesterol, Total: 203 mg/dL — ABNORMAL HIGH (ref 100–199)
HDL: 59 mg/dL (ref >39)
LDL CALC: 115 mg/dL — AB (ref 0–99)
Triglycerides: 146 mg/dL (ref 0–149)
VLDL CHOLESTEROL CAL: 29 mg/dL (ref 5–40)

## 2017-05-13 LAB — CMP14+EGFR
ALK PHOS: 79 IU/L (ref 39–117)
ALT: 32 IU/L (ref 0–32)
AST: 36 IU/L (ref 0–40)
Albumin/Globulin Ratio: 1.6 (ref 1.2–2.2)
Albumin: 4.6 g/dL (ref 3.5–4.8)
BUN / CREAT RATIO: 15 (ref 12–28)
BUN: 13 mg/dL (ref 8–27)
Bilirubin Total: 0.4 mg/dL (ref 0.0–1.2)
CO2: 21 mmol/L (ref 20–29)
CREATININE: 0.89 mg/dL (ref 0.57–1.00)
Calcium: 9.2 mg/dL (ref 8.7–10.3)
Chloride: 101 mmol/L (ref 96–106)
GFR calc Af Amer: 74 mL/min/{1.73_m2} (ref >59)
GFR calc non Af Amer: 64 mL/min/{1.73_m2} (ref >59)
GLOBULIN, TOTAL: 2.8 g/dL (ref 1.5–4.5)
Glucose: 135 mg/dL — ABNORMAL HIGH (ref 65–99)
POTASSIUM: 4.3 mmol/L (ref 3.5–5.2)
SODIUM: 141 mmol/L (ref 134–144)
Total Protein: 7.4 g/dL (ref 6.0–8.5)

## 2017-05-16 LAB — PAP IG (IMAGE GUIDED): PAP Smear Comment: 0

## 2017-05-16 LAB — THYROID PANEL WITH TSH
Free Thyroxine Index: 1.2 (ref 1.2–4.9)
T3 UPTAKE RATIO: 20 % — AB (ref 24–39)
T4 TOTAL: 5.9 ug/dL (ref 4.5–12.0)
TSH: 3.41 u[IU]/mL (ref 0.450–4.500)

## 2017-05-16 LAB — SPECIMEN STATUS REPORT

## 2017-09-30 ENCOUNTER — Emergency Department (HOSPITAL_COMMUNITY): Payer: Medicare Other

## 2017-09-30 ENCOUNTER — Emergency Department (HOSPITAL_COMMUNITY)
Admission: EM | Admit: 2017-09-30 | Discharge: 2017-09-30 | Disposition: A | Discharge status: Home / Self Care | Payer: Medicare Other | Attending: Emergency Medicine | Admitting: Emergency Medicine

## 2017-09-30 ENCOUNTER — Other Ambulatory Visit: Payer: Self-pay

## 2017-09-30 ENCOUNTER — Encounter (HOSPITAL_COMMUNITY): Payer: Self-pay | Admitting: Emergency Medicine

## 2017-09-30 DIAGNOSIS — E872 Acidosis, unspecified: Secondary | ICD-10-CM

## 2017-09-30 DIAGNOSIS — I10 Essential (primary) hypertension: Secondary | ICD-10-CM | POA: Diagnosis not present

## 2017-09-30 DIAGNOSIS — R1110 Vomiting, unspecified: Secondary | ICD-10-CM

## 2017-09-30 DIAGNOSIS — R42 Dizziness and giddiness: Secondary | ICD-10-CM | POA: Diagnosis not present

## 2017-09-30 DIAGNOSIS — R0902 Hypoxemia: Secondary | ICD-10-CM | POA: Diagnosis not present

## 2017-09-30 DIAGNOSIS — N179 Acute kidney failure, unspecified: Secondary | ICD-10-CM | POA: Insufficient documentation

## 2017-09-30 DIAGNOSIS — R231 Pallor: Secondary | ICD-10-CM | POA: Diagnosis not present

## 2017-09-30 DIAGNOSIS — E119 Type 2 diabetes mellitus without complications: Secondary | ICD-10-CM | POA: Diagnosis not present

## 2017-09-30 DIAGNOSIS — R112 Nausea with vomiting, unspecified: Secondary | ICD-10-CM | POA: Diagnosis not present

## 2017-09-30 DIAGNOSIS — R110 Nausea: Secondary | ICD-10-CM | POA: Diagnosis not present

## 2017-09-30 LAB — I-STAT TROPONIN, ED
Troponin i, poc: 0.03 ng/mL (ref 0.00–0.08)
Troponin i, poc: 0.02 ng/mL (ref 0.00–0.08)

## 2017-09-30 LAB — COMPREHENSIVE METABOLIC PANEL
ALBUMIN: 4.6 g/dL (ref 3.5–5.0)
ALT: 42 U/L (ref 0–44)
AST: 42 U/L — AB (ref 15–41)
Alkaline Phosphatase: 77 U/L (ref 38–126)
Anion gap: 15 (ref 5–15)
BILIRUBIN TOTAL: 0.7 mg/dL (ref 0.3–1.2)
BUN: 14 mg/dL (ref 8–23)
CHLORIDE: 105 mmol/L (ref 98–111)
CO2: 21 mmol/L — ABNORMAL LOW (ref 22–32)
Calcium: 10.1 mg/dL (ref 8.9–10.3)
Creatinine, Ser: 1.3 mg/dL — ABNORMAL HIGH (ref 0.44–1.00)
GFR calc Af Amer: 46 mL/min — ABNORMAL LOW (ref >60)
GFR calc non Af Amer: 39 mL/min — ABNORMAL LOW (ref >60)
GLUCOSE: 137 mg/dL — AB (ref 70–99)
POTASSIUM: 4.1 mmol/L (ref 3.5–5.1)
Sodium: 141 mmol/L (ref 135–145)
TOTAL PROTEIN: 7.9 g/dL (ref 6.5–8.1)

## 2017-09-30 LAB — CBC
HEMATOCRIT: 45 % (ref 36.0–46.0)
Hemoglobin: 14.1 g/dL (ref 12.0–15.0)
MCH: 28.3 pg (ref 26.0–34.0)
MCHC: 31.3 g/dL (ref 30.0–36.0)
MCV: 90.4 fL (ref 78.0–100.0)
PLATELETS: 188 10*3/uL (ref 150–400)
RBC: 4.98 MIL/uL (ref 3.87–5.11)
RDW: 13.4 % (ref 11.5–15.5)
WBC: 6.3 10*3/uL (ref 4.0–10.5)

## 2017-09-30 LAB — URINALYSIS, ROUTINE W REFLEX MICROSCOPIC
BILIRUBIN URINE: NEGATIVE
Glucose, UA: NEGATIVE mg/dL
HGB URINE DIPSTICK: NEGATIVE
Ketones, ur: 5 mg/dL — AB
Leukocytes, UA: NEGATIVE
Nitrite: NEGATIVE
PROTEIN: NEGATIVE mg/dL
Specific Gravity, Urine: 1.016 (ref 1.005–1.030)
pH: 5 (ref 5.0–8.0)

## 2017-09-30 LAB — TYPE AND SCREEN
ABO/RH(D): O POS
Antibody Screen: NEGATIVE

## 2017-09-30 LAB — ABO/RH: ABO/RH(D): O POS

## 2017-09-30 LAB — LIPASE, BLOOD: LIPASE: 40 U/L (ref 11–51)

## 2017-09-30 LAB — I-STAT CG4 LACTIC ACID, ED
LACTIC ACID, VENOUS: 4.79 mmol/L — AB (ref 0.5–1.9)
Lactic Acid, Venous: 4.58 mmol/L (ref 0.5–1.9)

## 2017-09-30 LAB — CBG MONITORING, ED
GLUCOSE-CAPILLARY: 124 mg/dL — AB (ref 70–99)
Glucose-Capillary: 147 mg/dL — ABNORMAL HIGH (ref 70–99)

## 2017-09-30 LAB — PROTIME-INR
INR: 1
PROTHROMBIN TIME: 13.1 s (ref 11.4–15.2)

## 2017-09-30 MED ORDER — ONDANSETRON HCL 4 MG/2ML IJ SOLN
4.0000 mg | Freq: Once | INTRAMUSCULAR | Status: AC
  Administered 2017-09-30: 4 mg via INTRAVENOUS
  Filled 2017-09-30: qty 2

## 2017-09-30 MED ORDER — SODIUM CHLORIDE 0.9 % IV BOLUS
1000.0000 mL | Freq: Once | INTRAVENOUS | Status: AC
  Administered 2017-09-30: 1000 mL via INTRAVENOUS

## 2017-09-30 MED ORDER — ONDANSETRON HCL 4 MG PO TABS: 4.0000 mg | ORAL_TABLET | Freq: Four times a day (QID) | ORAL | 0 refills | Status: DC

## 2017-09-30 NOTE — Discharge Instructions (Signed)
You were evaluated in the Emergency Department and after careful evaluation, we did not find any emergent condition requiring admission or further testing in the hospital.  Please return to the Emergency Department if you experience any worsening of your condition.  Drink plenty of fluids at home.  We encourage you to follow up with a primary care provider.  Thank you for allowing [place] to be a part of your care.

## 2017-09-30 NOTE — ED Notes (Addendum)
Checked CBG 147

## 2017-09-30 NOTE — ED Notes (Signed)
Got patient undress on the monitor patient is resting with call bell in reach 

## 2017-09-30 NOTE — ED Notes (Signed)
Pt given a [place] sandwich meal and one also given to pt spouse. Graham crackers and peanut butter included

## 2017-09-30 NOTE — ED Provider Notes (Signed)
[facility] Emergency Department Provider Note MRN:  [id number]  Arrival date & time: 09/30/17     Chief Complaint   Emesis   History of Present Illness   Tami Villanueva is a 77 y.o. year-old female with a history of diabetes, hypertension, hyperlipidemia presenting to the ED with chief complaint of emesis.  Patient explains that she took her morning Lantus dose this morning, and soon after began experiencing sudden onset nausea and persistent nonbloody nonbilious emesis.  Multiple episodes in route and here in the ED.  Denies any recent fevers, no headache or vision change, no chest pain or shortness of breath, no abdominal pain, no dysuria.  Blood glucose measured by EMS was within normal limits.  No exacerbating or relieving factors other than noted above, denies any coingestions.  Review of Systems  Positive for emesis, general malaise.  All other systems reviewed and negative.  Patient's Health History    Past Medical History:  Diagnosis Date  . Diabetes mellitus without complication ([place])   . Hyperlipidemia   . Hypertension     History reviewed. No pertinent surgical history.  Family History  Problem Relation Age of Onset  . Diabetes Father   . Heart disease Father   . Heart disease Sister     Social History   Socioeconomic History  . Marital status: Married    Spouse name: Not on file  . Number of children: Not on file  . Years of education: Not on file  . Highest education level: Not on file  Occupational History  . Not on file  Social Needs  . Financial resource strain: Not on file  . Food insecurity:    Worry: Not on file    Inability: Not on file  . Transportation needs:    Medical: Not on file    Non-medical: Not on file  Tobacco Use  . Smoking status: Never Smoker  . Smokeless tobacco: Never Used  Substance and Sexual Activity  . Alcohol use: No  . Drug use: No  . Sexual activity: Not on file  Lifestyle  . Physical activity:   Days per week: Not on file    Minutes per session: Not on file  . Stress: Not on file  Relationships  . Social connections:    Talks on phone: Not on file    Gets together: Not on file    Attends religious service: Not on file    Active member of club or organization: Not on file    Attends meetings of clubs or organizations: Not on file    Relationship status: Not on file  . Intimate partner violence:    Fear of current or ex partner: Not on file    Emotionally abused: Not on file    Physically abused: Not on file    Forced sexual activity: Not on file  Other Topics Concern  . Not on file  Social History Narrative  . Not on file     Physical Exam  Vital Signs and Nursing Notes reviewed Vitals:   09/30/17 1600 09/30/17 1630  BP: 119/68 120/69  Pulse: 92 90  Resp: 15 18  Temp:    SpO2: 97% 99%    CONSTITUTIONAL: Ill-appearing, actively vomiting NEURO: Somnolent, wakes to voice, no focal deficits EYES:  eyes equal and reactive ENT/NECK:  no LAD, no JVD CARDIO: Regular rate, well-perfused, normal S1 and S2 PULM:  CTAB no wheezing or rhonchi GI/GU:  normal bowel sounds, non-distended, non-tender  MSK/SPINE:  No gross deformities, no edema SKIN:  no rash, atraumatic PSYCH:  Appropriate speech and behavior  Diagnostic and Interventional Summary    EKG Interpretation  Date/Time:  Saturday September 30 2017 12:32:48 EDT Ventricular Rate:  77 PR Interval:    QRS Duration: 102 QT Interval:  416 QTC Calculation: 471 R Axis:   34 Text Interpretation:  Sinus rhythm Minimal ST depression, inferior leads Confirmed by Gerlene Fee [phone]) on 09/30/2017 3:36:52 PM      Labs Reviewed  COMPREHENSIVE METABOLIC PANEL - Abnormal; Notable for the following components:      Result Value   CO2 21 (*)    Glucose, Bld 137 (*)    Creatinine, Ser 1.30 (*)    AST 42 (*)    GFR calc non Af Amer 39 (*)    GFR calc Af Amer 46 (*)    All other components within normal limits  CBG  MONITORING, ED - Abnormal; Notable for the following components:   Glucose-Capillary 124 (*)    All other components within normal limits  I-STAT CG4 LACTIC ACID, ED - Abnormal; Notable for the following components:   Lactic Acid, Venous 4.58 (*)    All other components within normal limits  CBG MONITORING, ED - Abnormal; Notable for the following components:   Glucose-Capillary 147 (*)    All other components within normal limits  I-STAT CG4 LACTIC ACID, ED - Abnormal; Notable for the following components:   Lactic Acid, Venous 4.79 (*)    All other components within normal limits  CBC  LIPASE, BLOOD  PROTIME-INR  URINALYSIS, ROUTINE W REFLEX MICROSCOPIC  I-STAT TROPONIN, ED  I-STAT TROPONIN, ED  TYPE AND SCREEN  ABO/RH    DG Chest Port 1 View  Final Result      Medications  sodium chloride 0.9 % bolus 1,000 mL (has no administration in time range)  ondansetron (ZOFRAN) injection 4 mg (4 mg Intravenous Given 09/30/17 1312)  sodium chloride 0.9 % bolus 1,000 mL (0 mLs Intravenous Stopped 09/30/17 1543)     Procedures Critical Care  ED Course and Medical Decision Making  I have reviewed the triage vital signs and the nursing notes.  Pertinent labs & imaging results that were available during my care of the patient were reviewed by me and considered in my medical decision making (see below for details).    Unclear etiology of persistent nausea and vomiting in the 77 year old female, history of diabetes and hypertension.  Occurred after insulin therapy, but not hypoglycemic.  No focal neurological deficits, not complaining of vertigo, no nystagmus, nothing to suggest central process.  Given her ill-appearing nature initially, had considered CT of the head to exclude significant intracranial process.  However, patient has significantly recovered, now largely asymptomatic.  Currently tolerating p.o. in the ED.  Continued reassuring neurological exam.  Labs reveal mild AKI, lactic  acidosis of 4, these numbers taken prior to fluid resuscitation.  Will ensure resolution of this lactic acidosis with a repeat lactic level, will also obtain second troponin.  If labs are improving/reassuring, and patient continues to tolerate p.o., will consider discharge with close PCP follow-up.  Repeat lactate and slightly increased from prior, is surprising finding given patient's continued much improved clinical status.  Patient does not wish to be admitted to hospital, requesting discharge.  Completely back to baseline, repeat abdominal exam again reassuring.  Continue normal neurological exam.  Will provide additional liter IV fluids, continue p.o. fluids and nutrition.  Will  recheck lactate at 6 PM to ensure resolution, anticipating discharge thereafter.  If continued rising lactate, will admit for observation.  Signed out to Dr. Wilson Singer at shift change.  Barth Kirks. Sedonia Small, [place] [email or web address]  Final Clinical Impressions(s) / ED Diagnoses     ICD-10-CM   1. Lactic acidosis E87.2   2. Emesis R11.10 DG Chest [facility] 1 View    DG Chest Port 1 View  3. AKI (acute kidney injury) [facility]) N17.9     ED Discharge Orders    None         Maudie Flakes, MD 09/30/17 [id number]

## 2017-09-30 NOTE — ED Triage Notes (Signed)
Per [facility] pt was picked up from work, states she went to take her lantus shot and when returned to work became very nauseated and light headed. CBG 111. Denies any pain. Pt appears lethargic.

## 2017-09-30 NOTE — ED Notes (Signed)
Got patient a coke per the doctor patient is resting with family at bedside

## 2017-09-30 NOTE — ED Notes (Signed)
Dr Sedonia Small informed of lactic acid results 4.79

## 2017-09-30 NOTE — ED Notes (Signed)
Pt requested diet coke pt given the same

## 2017-09-30 NOTE — ED Notes (Signed)
Stuck patient for her labels at 1:30 pm in her right [facility] and also got a typing screen on patient pt. Is resting with call bell in reach

## 2017-10-04 ENCOUNTER — Ambulatory Visit (INDEPENDENT_AMBULATORY_CARE_PROVIDER_SITE_OTHER): Payer: Medicare Other | Admitting: Pediatrics

## 2017-10-04 ENCOUNTER — Telehealth: Payer: Self-pay | Admitting: Nurse Practitioner

## 2017-10-04 ENCOUNTER — Encounter: Payer: Self-pay | Admitting: Pediatrics

## 2017-10-04 VITALS — BP 162/89 | HR 75 | Temp 98.1°F | Ht 63.0 in | Wt 172.6 lb

## 2017-10-04 DIAGNOSIS — I10 Essential (primary) hypertension: Secondary | ICD-10-CM

## 2017-10-04 DIAGNOSIS — E119 Type 2 diabetes mellitus without complications: Secondary | ICD-10-CM

## 2017-10-04 DIAGNOSIS — E1022 Type 1 diabetes mellitus with diabetic chronic kidney disease: Secondary | ICD-10-CM | POA: Diagnosis not present

## 2017-10-04 DIAGNOSIS — N182 Chronic kidney disease, stage 2 (mild): Secondary | ICD-10-CM

## 2017-10-04 DIAGNOSIS — E872 Acidosis, unspecified: Secondary | ICD-10-CM

## 2017-10-04 LAB — BMP8+EGFR
BUN / CREAT RATIO: 9 — AB (ref 12–28)
BUN: 8 mg/dL (ref 8–27)
CHLORIDE: 100 mmol/L (ref 96–106)
CO2: 24 mmol/L (ref 20–29)
Calcium: 9.7 mg/dL (ref 8.7–10.3)
Creatinine, Ser: 0.9 mg/dL (ref 0.57–1.00)
GFR calc non Af Amer: 63 mL/min/{1.73_m2} (ref >59)
GFR, EST AFRICAN AMERICAN: 73 mL/min/{1.73_m2} (ref >59)
Glucose: 126 mg/dL — ABNORMAL HIGH (ref 65–99)
Potassium: 4.2 mmol/L (ref 3.5–5.2)
Sodium: 140 mmol/L (ref 134–144)

## 2017-10-04 LAB — BAYER DCA HB A1C WAIVED: HB A1C (BAYER DCA - WAIVED): 7.3 % — ABNORMAL HIGH (ref <7.0)

## 2017-10-04 MED ORDER — ONETOUCH DELICA LANCETS 33G MISC: 3 refills | Status: DC

## 2017-10-04 MED ORDER — GLUCOSE BLOOD VI STRP: ORAL_STRIP | 12 refills | Status: DC

## 2017-10-04 MED ORDER — INSULIN GLARGINE 100 UNIT/ML SOLOSTAR PEN: 20.0000 [IU] | PEN_INJECTOR | Freq: Every day | SUBCUTANEOUS | 6 refills | Status: DC

## 2017-10-04 MED ORDER — INSULIN GLARGINE 100 UNIT/ML SOLOSTAR PEN: 20.0000 [IU] | PEN_INJECTOR | Freq: Every day | SUBCUTANEOUS | 0 refills | Status: DC

## 2017-10-04 MED ORDER — ONETOUCH ULTRASOFT LANCETS MISC: 12 refills | Status: DC

## 2017-10-04 MED ORDER — LISINOPRIL 10 MG PO TABS: 10.0000 mg | ORAL_TABLET | Freq: Every day | ORAL | 1 refills | Status: DC

## 2017-10-04 NOTE — Patient Instructions (Signed)
Take fasting blood sugars every morning until I see you back in clinic. Write down and bring with you.  Check your blood pressure when you can at a drug store or can come by here for it to be checked. Let me know if >140 on top or >90 on bottom.  Stop metformin.  Restart lisinopril 10mg  once a day in the morning.

## 2017-10-04 NOTE — Telephone Encounter (Signed)
Note ready for pick up 

## 2017-10-04 NOTE — Progress Notes (Signed)
  Subjective:   Patient ID: Tami Villanueva, female    DOB: 03-07-1940, 76 y.o.   MRN: [id number] CC: Follow-up (ED follow up)  HPI: Tami Villanueva is a 77 y.o. female   Was seen in the emergency room 4 days ago for nausea and vomiting.  She had been feeling well the day prior.  That morning she woke up and went to work as usual.  She gave herself her usual dose Lantus, takes 15 units once a day.  Within 15 minutes she was feeling nauseous, she had several episodes of nonbloody nonbilious emesis.  She is feeling lightheaded and so were called EMS who took her to the emergency room.  BGL was reporte dot be normal by EMS.  In the ED she was given fluids, labs significant for lactate level 4.5, bicarb 21. Repeat lactate level was 4.7. Pt was feeling much improved back to baseline after fluids so was d/ced home. She has continue dot take lantus regularly at home. Has not taken lisinopril for weeks, concerned it was making her nauseous. She has been taking metformin 1056m BID.  Appetite has been normal past few days, normal stooling, no abd pain, no fevers or chills.  Relevant past medical, surgical, family and social history reviewed. Allergies and medications reviewed and updated. Social History   Tobacco Use  Smoking Status Never Smoker  Smokeless Tobacco Never Used   ROS: Per HPI   Objective:    BP (!) 162/89   Pulse 75   Temp 98.1 F (36.7 C) (Oral)   Ht _0  (1.6 m)   Wt 172 lb 9.6 oz (78.3 kg)   BMI 30.57 kg/m   Wt Readings from Last 3 Encounters:  10/04/17 172 lb 9.6 oz (78.3 kg)  05/12/17 171 lb (77.6 kg)  01/05/17 169 lb 12.8 oz (77 kg)    Gen: NAD, alert, cooperative with exam, NCAT EYES: EOMI, no conjunctival injection, or no icterus ENT:   OP without erythema LYMPH: no cervical LAD CV: NRRR, normal S1/S2, no murmur, distal pulses 2+ b/l Resp: CTABL, no wheezes, normal WOB Abd: +BS, soft, NTND.  Ext: No edema, warm Neuro: Alert and oriented, strength equal b/l UE and  LE, coordination grossly normal MSK: normal muscle bulk  Assessment & Plan:  DJuniwas seen today for follow-up med problems.  Diagnoses and all orders for this visit:  Type 2 diabetes mellitus without complication, without long-term current use of insulin (HCC) Stop metformin, cont lantus. rtc 3 weeks for f/u, check BGLs fasting every morning. -     Bayer DCA Hb A1c Waived  Essential hypertension Uncontrolled, restart below. Recheck BMP. Check Bps as able at home, bring to next visit. -     lisinopril (PRINIVIL,ZESTRIL) 10 MG tablet; Take 1 tablet (10 mg total) by mouth daily.  Lactic acid acidosis Elevated lactate level, no VBG drawn. Possibly due to metformin with decreased Cr clearance, Cr up to 1.3 from baseline of 0.9 in ED. Will stop metformin for now.  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 D/c metformin as above. -     BMP8+EGFR   Follow up plan: 3 weeks CAssunta Found MD [place]

## 2017-10-05 ENCOUNTER — Telehealth: Payer: Self-pay | Admitting: Nurse Practitioner

## 2017-10-05 NOTE — Telephone Encounter (Signed)
Pt aware of results 

## 2017-11-01 ENCOUNTER — Ambulatory Visit (INDEPENDENT_AMBULATORY_CARE_PROVIDER_SITE_OTHER): Payer: Medicare Other | Admitting: Pediatrics

## 2017-11-01 ENCOUNTER — Encounter: Payer: Self-pay | Admitting: Pediatrics

## 2017-11-01 VITALS — BP 133/81 | HR 78 | Temp 97.1°F | Ht 63.0 in | Wt 171.0 lb

## 2017-11-01 DIAGNOSIS — E119 Type 2 diabetes mellitus without complications: Secondary | ICD-10-CM

## 2017-11-01 DIAGNOSIS — I10 Essential (primary) hypertension: Secondary | ICD-10-CM

## 2017-11-01 NOTE — Progress Notes (Signed)
  Subjective:   Patient ID: Tami Villanueva, female    DOB: 04/09/1940, 77 y.o.   MRN: [id number] CC: Diabetes (4 wk rck) and Hypertension  HPI: Tami Villanueva is a 77 y.o. female   DM2: AM BGLs 130s-low 200s Thinks mostly under 150. Metformin was stopped last visit after recent episode of lactic acidosis with no other obvious cause.  HTN: no lightheadedness, dizziness. Taking lisinopril regularly  Relevant past medical, surgical, family and social history reviewed. Allergies and medications reviewed and updated. Social History   Tobacco Use  Smoking Status Never Smoker  Smokeless Tobacco Never Used   ROS: Per HPI   Objective:    BP 133/81   Pulse 78   Temp (!) 97.1 F (36.2 C) (Oral)   Ht 5\' 3"  (1.6 m)   Wt 171 lb (77.6 kg)   BMI 30.29 kg/m   Wt Readings from Last 3 Encounters:  11/01/17 171 lb (77.6 kg)  10/04/17 172 lb 9.6 oz (78.3 kg)  05/12/17 171 lb (77.6 kg)    Gen: NAD, alert, cooperative with exam, NCAT EYES: EOMI, no conjunctival injection, or no icterus ENT: OP without erythema LYMPH: no cervical LAD CV: NRRR, normal S1/S2, no murmur, distal pulses 2+ b/l Resp: CTABL, no wheezes, normal WOB Abd: +BS, soft, NTND. no guarding or organomegaly Ext: No edema, warm Neuro: Alert and oriented  Assessment & Plan:  Tami Villanueva was seen today for diabetes and hypertension.  Diagnoses and all orders for this visit:  Essential hypertension Stable, cont medicine  Type 2 diabetes mellitus without complication, without long-term current use of insulin (HCC) Sugars in morning fairly well controlled despite recent dc of metformin. Cont current meds, f/u as scheduled for next A1c  Follow up plan: Return in about 8 weeks (around 12/29/2017) for dm2 follow up. Assunta Found, MD Home

## 2017-11-04 ENCOUNTER — Encounter: Payer: Self-pay | Admitting: Pediatrics

## 2017-12-08 DIAGNOSIS — Z1231 Encounter for screening mammogram for malignant neoplasm of breast: Secondary | ICD-10-CM | POA: Diagnosis not present

## 2017-12-08 LAB — HM MAMMOGRAPHY

## 2017-12-22 ENCOUNTER — Encounter: Payer: Self-pay | Admitting: *Deleted

## 2017-12-22 ENCOUNTER — Ambulatory Visit (INDEPENDENT_AMBULATORY_CARE_PROVIDER_SITE_OTHER): Payer: Medicare Other

## 2017-12-22 ENCOUNTER — Ambulatory Visit (INDEPENDENT_AMBULATORY_CARE_PROVIDER_SITE_OTHER): Payer: Medicare Other | Admitting: *Deleted

## 2017-12-22 VITALS — BP 135/82 | HR 78 | Ht 63.0 in | Wt 172.0 lb

## 2017-12-22 DIAGNOSIS — Z780 Asymptomatic menopausal state: Secondary | ICD-10-CM

## 2017-12-22 DIAGNOSIS — Z0000 Encounter for general adult medical examination without abnormal findings: Secondary | ICD-10-CM

## 2017-12-22 DIAGNOSIS — Z1211 Encounter for screening for malignant neoplasm of colon: Secondary | ICD-10-CM

## 2017-12-22 DIAGNOSIS — M8589 Other specified disorders of bone density and structure, multiple sites: Secondary | ICD-10-CM | POA: Diagnosis not present

## 2017-12-22 DIAGNOSIS — Z1212 Encounter for screening for malignant neoplasm of rectum: Principal | ICD-10-CM

## 2017-12-22 LAB — HM DIABETES EYE EXAM

## 2017-12-22 NOTE — Progress Notes (Addendum)
Subjective:   Tami Villanueva is a 77 y.o. female who presents for an initial Medicare Annual Wellness Visit.  Patient Care Team: Eustaquio Maize, MD as PCP - General (Pediatrics) Warren Danes, PA-C as Physician Assistant (Dermatology)  Hospitalizations, surgeries, and ER visits in previous 12 months No hospitalizations, ER visits, or surgeries this past year.   Review of Systems    Patient reports that her overall health is unchanged compared to last year.  Cardiac Risk Factors include: advanced age (>67mn, >>94women);diabetes mellitus;dyslipidemia;hypertension;obesity (BMI >30kg/m2);family history of premature cardiovascular disease   All other systems negative     Current Medications (verified) Outpatient Encounter Medications as of 12/22/2017  Medication Sig  . Blood Glucose Monitoring Suppl (ONE TOUCH ULTRA 2) W/DEVICE KIT Dx 250.02  . glucose blood (ONE TOUCH ULTRA TEST) test strip Test 1 x pr day and prn  . Insulin Glargine (LANTUS SOLOSTAR) 100 UNIT/ML Solostar Pen Inject 20 Units into the skin daily at 10 pm.  . Lancets (ONETOUCH ULTRASOFT) lancets Test 1x per day and prn  . lisinopril (PRINIVIL,ZESTRIL) 10 MG tablet Take 1 tablet (10 mg total) by mouth daily.  . ondansetron (ZOFRAN) 4 MG tablet Take 1 tablet (4 mg total) by mouth every 6 (six) hours.  .Glory RosebushDELICA LANCETS 338TMISC 1 daily and prn to check blood sugar  . simvastatin (ZOCOR) 40 MG tablet Take 1 tablet (40 mg total) by mouth at bedtime.   No facility-administered encounter medications on file as of 12/22/2017.     Allergies (verified) Codeine; Iodine; Metformin and related; Penicillins; and Sulfa antibiotics   History: Past Medical History:  Diagnosis Date  . Diabetes mellitus without complication ([place]   . Hyperlipidemia   . Hypertension    History reviewed. No pertinent surgical history. Family History  Problem Relation Age of Onset  . Diabetes Father   . Heart disease Father     . Heart disease Sister   . COPD Sister   . Heart disease Sister   . Diabetes Son   . Pneumonia Son    Social History   Socioeconomic History  . Marital status: Married    Spouse name: Not on file  . Number of children: 2  . Years of education: 147 . Highest education level: 12th grade  Occupational History  . Not on file  Social Needs  . Financial resource strain: Not hard at all  . Food insecurity:    Worry: Never true    Inability: Never true  . Transportation needs:    Medical: No    Non-medical: No  Tobacco Use  . Smoking status: Never Smoker  . Smokeless tobacco: Never Used  Substance and Sexual Activity  . Alcohol use: No  . Drug use: No  . Sexual activity: Not Currently  Lifestyle  . Physical activity:    Days per week: 5 days    Minutes per session: 60 min  . Stress: To some extent  Relationships  . Social connections:    Talks on phone: More than three times a week    Gets together: More than three times a week    Attends religious service: More than 4 times per year    Active member of club or organization: Yes    Attends meetings of clubs or organizations: More than 4 times per year    Relationship status: Married  Other Topics Concern  . Not on file  Social History Narrative   Lives  at home with her husband. He prepares most of their meals and they have a great relationship. She has one daughter and her son passed away a few years ago from complications with pneumonia. She works at [employer] in [place] over 40 hours a week. 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      Clinical Intake:  Pre-visit preparation completed: No  Pain : No/denies pain     Nutritional Status: BMI > 30  Obese(Breakfast-iced coffee & biscuit or oatmeal, Lunch-packs lunch, Dinner-husband prepares meals. Diet Coke or Mt Dew. Doesn't drink very much water. ) Diabetes: Yes CBG done?: No Did pt. bring in CBG monitor from home?: No  How  often do you need to have someone help you when you read instructions, pamphlets, or other written materials from your doctor or pharmacy?: 1 - Never What is the last grade level you completed in school?: 12th     Information entered by :: Chong Sicilian, RN   Activities of Daily Living In your present state of health, do you have any difficulty performing the following activities: 12/22/2017  Hearing? N  Vision? N  Comment Eye exam this afternoon with Dr Rona Ravens  Difficulty concentrating or making decisions? N  Walking or climbing stairs? N  Dressing or bathing? N  Doing errands, shopping? N  Preparing Food and eating ? N  Using the Toilet? N  In the past six months, have you accidently leaked urine? N  Do you have problems with loss of bowel control? N  Managing your Medications? N  Managing your Finances? N  Housekeeping or managing your Housekeeping? N  Some recent data might be hidden     Exercise Current Exercise Habits: The patient has a physically strenous job, but has no regular exercise apart from work., Exercise limited by: None identified   Depression Screen PHQ 2/9 Scores 12/22/2017 10/04/2017 05/12/2017 01/05/2017 06/13/2016 11/24/2015 08/18/2015  PHQ - 2 Score 0 0 0 0 0 0 0     Fall Risk Fall Risk  12/22/2017 10/04/2017 05/12/2017 01/05/2017 06/13/2016  Falls in the past year? 1 No No No No  Comment Tripped on clothes in the closet and fell on the chest of drawers - - - -  Number falls in past yr: 0 - - - -  Injury with Fall? 0 - - - -  Risk for fall due to : History of fall(s) - - - -  Follow up Falls prevention discussed - - - -     Objective:    Today's Vitals   12/22/17 1007  BP: 135/82  Pulse: 78  Weight: 172 lb (78 kg)  Height: 5' 3"  (1.6 m)   Body mass index is 30.47 kg/m.  Advanced Directives 12/22/2017 09/30/2017  Does Patient Have a Medical Advance Directive? No No  Would patient like information on creating a medical advance directive? Yes  (MAU/Ambulatory/Procedural Areas - Information given) -    Hearing/Vision  No hearing or vision deficits noted during visit.  Cognitive Function:     6CIT Screen 12/22/2017  What Year? 0 points  What month? 0 points  What time? 0 points  Count back from 20 0 points  Months in reverse 0 points  Repeat phrase 0 points  Total Score 0   Normal Cognitive Function Screening: Yes    Immunizations and Health Maintenance Immunization History  Administered Date(s) Administered  . Influenza, High Dose Seasonal PF 10/20/2013, 11/03/2015, 10/01/2016, 10/25/2017  . Pneumococcal Conjugate-13  11/18/2013  . Pneumococcal Polysaccharide-23 08/18/2015   Health Maintenance Due  Topic Date Due  . Fecal DNA (Cologuard)  10/27/1992  . DEXA SCAN  10/28/2007  . OPHTHALMOLOGY EXAM  09/23/2017   Health Maintenance  Topic Date Due  . Fecal DNA (Cologuard)  10/27/1992  . DEXA SCAN  10/28/2007  . OPHTHALMOLOGY EXAM  09/23/2017  . HEMOGLOBIN A1C  04/06/2018  . FOOT EXAM  11/02/2018  . MAMMOGRAM  12/09/2018  . TETANUS/TDAP  11/16/2020  . INFLUENZA VACCINE  Completed  . PNA vac Low Risk Adult  Completed        Assessment:   This is a routine wellness examination for [place].    Plan:    Goals    . DIET - INCREASE WATER INTAKE    . Exercise 150 min/wk Moderate Activity        Health Maintenance & Additional Screening Recommendations  Bone densitometry screening Colorectal cancer screening Diabetic Eye Exam-request a report for our office  Lung: Low Dose CT Chest recommended if Age 47-80 years, 30 pack-year currently smoking OR have quit w/in 15years. Patient does not qualify. Hepatitis C Screening recommended: no HIV Screening recommended: no  Today's Orders Orders Placed This Encounter  Procedures  . DG [facility] DEXA    Order Specific Question:   Reason for Exam (SYMPTOM  OR DIAGNOSIS REQUIRED)    Answer:   post menopausal  . Cologuard    Keep f/u with Eustaquio Maize, MD and  any other specialty appointments you may have Continue current medications Move carefully to avoid falls. Use assistive devices like a cane or walker if needed. Aim for at least 150 minutes of moderate activity a week. This can be done with chair exercises if necessary. Read or work on puzzles daily Stay connected with friends and family  I have personally reviewed and noted the following in the patient's chart:   . Medical and social history . Use of alcohol, tobacco or illicit drugs  . Current medications and supplements . Functional ability and status . Nutritional status . Physical activity . Advanced directives . List of other physicians . Hospitalizations, surgeries, and ER visits in previous 12 months . Vitals . Screenings to include cognitive, depression, and falls . Referrals and appointments  In addition, I have reviewed and discussed with patient certain preventive protocols, quality metrics, and best practice recommendations. A written personalized care plan for preventive services as well as general preventive health recommendations were provided to patient.     Chong Sicilian, RN   12/22/2017

## 2017-12-22 NOTE — Patient Instructions (Signed)
  Tami Villanueva , Thank you for taking time to come for your Medicare Wellness Visit. I appreciate your ongoing commitment to your health goals. Please review the following plan we discussed and let me know if I can assist you in the future.   These are the goals we discussed: Goals    . DIET - INCREASE WATER INTAKE    . Exercise 150 min/wk Moderate Activity       This is a list of the screening recommended for you and due dates:  Health Maintenance  Topic Date Due  . Cologuard (Stool DNA test)  10/27/1992  . DEXA scan (bone density measurement)  10/28/2007  . Eye exam for diabetics  09/23/2017  . Hemoglobin A1C  04/06/2018  . Complete foot exam   11/02/2018  . Mammogram  12/09/2018  . Tetanus Vaccine  11/16/2020  . Flu Shot  Completed  . Pneumonia vaccines  Completed     Your doctor has prescribed Cologuard, an easy-to-use, noninvasive test for colon cancer screening, based on the latest advances in stool DNA science.   Here's what will happen next:   1. You may receive a call or email from [facility] to confirm your mailing address and insurance information 2. Your kit will be shipped directly to you 3. You collet your stool sample in the privacy of your own home 4. You return the kit via [facility] shipping or pick-up, in the same box it arrived in 5. You doctor will contact you with the results once they are available  Screening for colon cancer is very important to your good health, so if you have any questions at all, please call [facility]'s Customer Support Specialists at [phone]. They are available 24 hours a day, 6 days a week.

## 2017-12-27 ENCOUNTER — Ambulatory Visit: Payer: Medicare Other | Admitting: Pediatrics

## 2017-12-29 ENCOUNTER — Ambulatory Visit: Payer: Medicare Other | Admitting: Nurse Practitioner

## 2018-06-05 ENCOUNTER — Other Ambulatory Visit: Payer: Self-pay | Admitting: Pediatrics

## 2018-06-05 DIAGNOSIS — I10 Essential (primary) hypertension: Secondary | ICD-10-CM

## 2018-08-04 ENCOUNTER — Other Ambulatory Visit: Payer: Self-pay | Admitting: Family Medicine

## 2018-08-04 DIAGNOSIS — I10 Essential (primary) hypertension: Secondary | ICD-10-CM

## 2018-08-06 NOTE — Telephone Encounter (Signed)
[id number] 6/29

## 2018-08-06 NOTE — Telephone Encounter (Signed)
Former Nurse, mental health NTBS 30 days given 07/02/18

## 2018-08-20 ENCOUNTER — Other Ambulatory Visit: Payer: Self-pay | Admitting: Family Medicine

## 2018-08-20 DIAGNOSIS — I10 Essential (primary) hypertension: Secondary | ICD-10-CM

## 2018-08-21 NOTE — Telephone Encounter (Signed)
OV 08/24/18

## 2018-08-24 ENCOUNTER — Other Ambulatory Visit: Payer: Self-pay

## 2018-08-24 ENCOUNTER — Encounter (INDEPENDENT_AMBULATORY_CARE_PROVIDER_SITE_OTHER): Payer: Self-pay

## 2018-08-24 ENCOUNTER — Encounter: Payer: Self-pay | Admitting: Family Medicine

## 2018-08-24 ENCOUNTER — Ambulatory Visit (INDEPENDENT_AMBULATORY_CARE_PROVIDER_SITE_OTHER): Payer: Medicare Other | Admitting: Family Medicine

## 2018-08-24 VITALS — BP 156/91 | HR 92 | Temp 97.3°F | Ht 65.0 in | Wt 168.0 lb

## 2018-08-24 DIAGNOSIS — R5383 Other fatigue: Secondary | ICD-10-CM | POA: Diagnosis not present

## 2018-08-24 DIAGNOSIS — E1169 Type 2 diabetes mellitus with other specified complication: Secondary | ICD-10-CM | POA: Diagnosis not present

## 2018-08-24 DIAGNOSIS — I152 Hypertension secondary to endocrine disorders: Secondary | ICD-10-CM

## 2018-08-24 DIAGNOSIS — E1159 Type 2 diabetes mellitus with other circulatory complications: Secondary | ICD-10-CM | POA: Diagnosis not present

## 2018-08-24 DIAGNOSIS — I10 Essential (primary) hypertension: Secondary | ICD-10-CM | POA: Diagnosis not present

## 2018-08-24 DIAGNOSIS — R6889 Other general symptoms and signs: Secondary | ICD-10-CM | POA: Diagnosis not present

## 2018-08-24 DIAGNOSIS — E1165 Type 2 diabetes mellitus with hyperglycemia: Secondary | ICD-10-CM

## 2018-08-24 DIAGNOSIS — E785 Hyperlipidemia, unspecified: Secondary | ICD-10-CM

## 2018-08-24 DIAGNOSIS — E119 Type 2 diabetes mellitus without complications: Secondary | ICD-10-CM | POA: Diagnosis not present

## 2018-08-24 LAB — BAYER DCA HB A1C WAIVED: HB A1C (BAYER DCA - WAIVED): 11.2 % — ABNORMAL HIGH (ref <7.0)

## 2018-08-24 MED ORDER — LANTUS SOLOSTAR 100 UNIT/ML ~~LOC~~ SOPN: 21.0000 [IU] | PEN_INJECTOR | Freq: Every day | SUBCUTANEOUS | 0 refills | Status: DC

## 2018-08-24 MED ORDER — BLOOD GLUCOSE METER KIT: PACK | 0 refills | Status: DC

## 2018-08-24 NOTE — Progress Notes (Signed)
Subjective: CC: Type 2 diabetes with hypertension and hyperlipidemia PCP: No primary care provider on file. WKG:SUPJS Tami Villanueva is a 78 y.o. female presenting to clinic today for:  1. Type 2 Diabetes w/ HTN &HLD:  Patient reports: Glucometer:One touch Ultra, she notes that this monitor is well over 7 years old.  She is interested in [place] but does not want to check her blood sugar more than once per day.  High at home: 330s; Low at home: 130s, Taking medication(s): Lantus 20 units daily, Lisinopril 49m daily.  She does report to some dietary indiscretions recently.  She does report feeling fatigued and occasionally nauseated.  This does seem to correlate with the higher blood sugars.  Denies any loss of consciousness, sensation changes, shortness of breath or chest pain.  She wonders if the fatigue is related to a low B12 level.  She is not on anything for cholesterol.  She admits to not really liking to take her medications and therefore she is not on anything for cholesterol anymore.  Previously treated with Zocor.  She does not monitor her blood pressures at home.  She has not taken her medications today because she thought she had to have nothing in her stomach for her labs today.  Last eye exam: 12/2017 Last foot exam: 10/2017 Last A1c:  Lab Results  Component Value Date   HGBA1C 7.3 (H) 10/04/2017   Nephropathy screen indicated?: on ACE-I Last flu, zoster and/or pneumovax:  Immunization History  Administered Date(s) Administered  . Influenza, High Dose Seasonal PF 10/20/2013, 11/03/2015, 10/01/2016, 10/25/2017  . Pneumococcal Conjugate-13 11/18/2013  . Pneumococcal Polysaccharide-23 08/18/2015  . Tdap 11/26/2010    ROS: Per HPI  Allergies  Allergen Reactions  . Codeine Nausea And Vomiting  . Iodine Hives  . Metformin And Related     Lactic acidosis  . Penicillins Other (See Comments)    Passed out  . Sulfa Antibiotics Nausea And Vomiting   Past Medical History:   Diagnosis Date  . Diabetes mellitus without complication ([facility]   . Hyperlipidemia   . Hypertension     Current Outpatient Medications:  .  Insulin Glargine (LANTUS SOLOSTAR) 100 UNIT/ML Solostar Pen, Inject 20 Units into the skin daily at 10 pm., Disp: 5 pen, Rfl: 0 .  lisinopril (ZESTRIL) 10 MG tablet, TAKE 1 TABLET BY MOUTH  DAILY, Disp: 90 tablet, Rfl: 0 .  Blood Glucose Monitoring Suppl (ONE TOUCH ULTRA 2) W/DEVICE KIT, Dx 250.02, Disp: 1 each, Rfl: 0 .  glucose blood (ONE TOUCH ULTRA TEST) test strip, Test 1 x pr day and prn, Disp: 100 each, Rfl: 12 .  Lancets (ONETOUCH ULTRASOFT) lancets, Test 1x per day and prn, Disp: 100 each, Rfl: 12 .  ondansetron (ZOFRAN) 4 MG tablet, Take 1 tablet (4 mg total) by mouth every 6 (six) hours. (Patient not taking: Reported on 08/24/2018), Disp: 12 tablet, Rfl: 0 .  ONETOUCH DELICA LANCETS 331RMISC, 1 daily and prn to check blood sugar, Disp: 100 each, Rfl: 3 Social History   Socioeconomic History  . Marital status: Married    Spouse name: Not on file  . Number of children: 2  . Years of education: 116 . Highest education level: 12th grade  Occupational History  . Not on file  Social Needs  . Financial resource strain: Not hard at all  . Food insecurity    Worry: Never true    Inability: Never true  . Transportation needs  Medical: No    Non-medical: No  Tobacco Use  . Smoking status: Never Smoker  . Smokeless tobacco: Never Used  Substance and Sexual Activity  . Alcohol use: No  . Drug use: No  . Sexual activity: Not Currently  Lifestyle  . Physical activity    Days per week: 5 days    Minutes per session: 60 min  . Stress: To some extent  Relationships  . Social connections    Talks on phone: More than three times a week    Gets together: More than three times a week    Attends religious service: More than 4 times per year    Active member of club or organization: Yes    Attends meetings of clubs or organizations: More  than 4 times per year    Relationship status: Married  . Intimate partner violence    Fear of current or ex partner: No    Emotionally abused: No    Physically abused: No    Forced sexual activity: No  Other Topics Concern  . Not on file  Social History Narrative   Lives at home with her husband. He prepares most of their meals and they have a great relationship. She has one daughter and her son passed away a few years ago from complications with pneumonia. She works at [employer] in [place] over 40 hours a week. 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    Family History  Problem Relation Age of Onset  . Diabetes Father   . Heart disease Father   . Heart disease Sister   . COPD Sister   . Heart disease Sister   . Diabetes Son   . Pneumonia Son     Objective: Office vital signs reviewed. BP (!) 156/91   Pulse 92   Temp (!) 97.3 F (36.3 C) (Oral)   Ht 5' 5"  (1.651 m)   Wt 168 lb (76.2 kg)   BMI 27.96 kg/m   Physical Examination:  General: Awake, alert, well nourished, No acute distress HEENT: Normal, sclera white, MMM Cardio: regular rate and rhythm, S1S2 heard, no murmurs appreciated Pulm: clear to auscultation bilaterally, no wheezes, rhonchi or rales; normal work of breathing on room air Extremities: warm, well perfused, No edema, cyanosis or clubbing; +2 pulses bilaterally MSK: normal gait and station Skin: dry; intact; no rashes or lesions Neuro: DM foot exam below Diabetic Foot Exam - Simple   Simple Foot Form Diabetic Foot exam was performed with the following findings: Yes 08/24/2018 12:21 PM  Visual Inspection No deformities, no ulcerations, no other skin breakdown bilaterally: Yes Sensation Testing Intact to touch and monofilament testing bilaterally: Yes Pulse Check Posterior Tibialis and Dorsalis pulse intact bilaterally: Yes Comments Decreased vibratory sensation bilaterally.  No appreciable ulcers.  She does have a  small callus between her pinky toe and fourth digit on the left.     Assessment/ Plan: 78 y.o. female   1. Uncontrolled type 2 diabetes mellitus with hyperglycemia ([place]) Grossly uncontrolled diabetes with a large rise in A1c since last visit.  We discussed need for Lantus compliance and have instructed her to increase to 21 units nightly.  She will increase by 1 unit every 2 days for fasting blood sugars greater than 140.  This was reviewed with the patient, reiterated in her AVS.  She will follow-up in 3 months, sooner if needed.  I have also sent over a new prescription for her glucose meter and  diabetic testing supplies.  Check lipid panel, CMP.  Anticipate need for use of statin again.  I reiterated this to her but will wait for her lipid panel to return. - Bayer DCA Hb A1c Waived - CMP14+EGFR - Lipid Panel - Insulin Glargine (LANTUS SOLOSTAR) 100 UNIT/ML Solostar Pen; Inject 21-30 Units into the skin daily.  Dispense: 30 mL; Refill: 0 - blood glucose meter kit and supplies; Dispense based on patient and insurance preference. Use up to four times daily as directed. (FOR ICD-10 E10.9, E11.9).  Dispense: 1 each; Refill: 0  2. Hypertension associated with diabetes ([place]) Not at goal during today's visit but she has not taken her blood pressure medicine today.  There is also questionable compliance with medications.  I have recommended that she resume use of blood pressure medicines.  She will get a BP monitor and start recording her blood pressures.  We discussed goal of less than 140/90. - CMP14+EGFR - Lipid Panel  3. Hyperlipidemia associated with type 2 diabetes mellitus (HCC) Needs statin.  Anticipate sending in Lipitor  4. Fatigue, unspecified type Likely related to uncontrolled blood sugars and this was reiterated during today's visit.  I have added B12 per her request to her laboratory drawn today. - Vitamin B12   Orders Placed This Encounter  Procedures  . Bayer DCA Hb A1c  Waived  . CMP14+EGFR  . Lipid Panel   No orders of the defined types were placed in this encounter.    Tami Norlander, DO [place] [phone]

## 2018-08-24 NOTE — Patient Instructions (Addendum)
Your sugar has gone up tremendously since your last visit.  Your A1c today was 11.2.  This is uncontrolled diabetes.  I want you to increase your Lantus as follows:  Increase by 1 unit every TWO days for Morning Fasting blood sugar above 140.  Ex: Day 1: Blood sugar 201; Day 2: Blood sugar 195. Increase to 21 units  Day 3: Blood sugar 149, Day 4 Blood sugar 169, Increase to 22 units  Once you have reached blood sugars in the morning of less than 140, you can stop at that dose.  See me in 3 months for recheck blood sugar.  I have sent in a new meter to your mail order.  Start checking your blood pressure.  Your goal is less than 140/90.  Send in your colon cancer screening kit as soon as possible.

## 2018-08-25 LAB — CMP14+EGFR
ALT: 34 IU/L — ABNORMAL HIGH (ref 0–32)
AST: 34 IU/L (ref 0–40)
Albumin/Globulin Ratio: 1.9 (ref 1.2–2.2)
Albumin: 4.7 g/dL (ref 3.7–4.7)
Alkaline Phosphatase: 91 IU/L (ref 39–117)
BUN/Creatinine Ratio: 11 — ABNORMAL LOW (ref 12–28)
BUN: 9 mg/dL (ref 8–27)
Bilirubin Total: 0.6 mg/dL (ref 0.0–1.2)
CO2: 21 mmol/L (ref 20–29)
Calcium: 9.4 mg/dL (ref 8.7–10.3)
Chloride: 97 mmol/L (ref 96–106)
Creatinine, Ser: 0.84 mg/dL (ref 0.57–1.00)
GFR calc Af Amer: 79 mL/min/{1.73_m2} (ref >59)
GFR calc non Af Amer: 68 mL/min/{1.73_m2} (ref >59)
Globulin, Total: 2.5 g/dL (ref 1.5–4.5)
Glucose: 195 mg/dL — ABNORMAL HIGH (ref 65–99)
Potassium: 4.2 mmol/L (ref 3.5–5.2)
Sodium: 140 mmol/L (ref 134–144)
Total Protein: 7.2 g/dL (ref 6.0–8.5)

## 2018-08-25 LAB — LIPID PANEL
Chol/HDL Ratio: 4.2 ratio (ref 0.0–4.4)
Cholesterol, Total: 245 mg/dL — ABNORMAL HIGH (ref 100–199)
HDL: 59 mg/dL (ref >39)
LDL Calculated: 154 mg/dL — ABNORMAL HIGH (ref 0–99)
Triglycerides: 159 mg/dL — ABNORMAL HIGH (ref 0–149)
VLDL Cholesterol Cal: 32 mg/dL (ref 5–40)

## 2018-08-25 LAB — VITAMIN B12: Vitamin B-12: 253 pg/mL (ref 232–1245)

## 2018-08-27 ENCOUNTER — Other Ambulatory Visit: Payer: Self-pay | Admitting: Family Medicine

## 2018-08-27 MED ORDER — ATORVASTATIN CALCIUM 20 MG PO TABS: 20.0000 mg | ORAL_TABLET | Freq: Every day | ORAL | 3 refills | Status: DC

## 2018-08-28 ENCOUNTER — Other Ambulatory Visit: Payer: Self-pay | Admitting: *Deleted

## 2018-08-28 MED ORDER — ONETOUCH ULTRA 2 W/DEVICE KIT: PACK | 0 refills | Status: DC

## 2018-08-29 ENCOUNTER — Other Ambulatory Visit: Payer: Self-pay | Admitting: *Deleted

## 2018-08-29 MED ORDER — ATORVASTATIN CALCIUM 20 MG PO TABS: 20.0000 mg | ORAL_TABLET | Freq: Every day | ORAL | 3 refills | Status: DC

## 2018-09-04 DIAGNOSIS — Z1211 Encounter for screening for malignant neoplasm of colon: Secondary | ICD-10-CM | POA: Diagnosis not present

## 2018-09-04 DIAGNOSIS — Z1212 Encounter for screening for malignant neoplasm of rectum: Secondary | ICD-10-CM | POA: Diagnosis not present

## 2018-09-20 LAB — COLOGUARD: Cologuard: NEGATIVE

## 2018-10-29 ENCOUNTER — Other Ambulatory Visit: Payer: Self-pay | Admitting: Family Medicine

## 2018-10-29 DIAGNOSIS — I10 Essential (primary) hypertension: Secondary | ICD-10-CM

## 2018-11-22 ENCOUNTER — Telehealth: Payer: Self-pay | Admitting: Family Medicine

## 2018-11-23 ENCOUNTER — Other Ambulatory Visit: Payer: Self-pay

## 2018-11-23 ENCOUNTER — Encounter: Payer: Self-pay | Admitting: Family Medicine

## 2018-11-23 ENCOUNTER — Ambulatory Visit (INDEPENDENT_AMBULATORY_CARE_PROVIDER_SITE_OTHER): Payer: Medicare Other | Admitting: Family Medicine

## 2018-11-23 VITALS — BP 142/84 | HR 79 | Temp 98.1°F | Ht 65.0 in | Wt 173.0 lb

## 2018-11-23 DIAGNOSIS — E1165 Type 2 diabetes mellitus with hyperglycemia: Secondary | ICD-10-CM | POA: Diagnosis not present

## 2018-11-23 DIAGNOSIS — E785 Hyperlipidemia, unspecified: Secondary | ICD-10-CM

## 2018-11-23 DIAGNOSIS — E1159 Type 2 diabetes mellitus with other circulatory complications: Secondary | ICD-10-CM

## 2018-11-23 DIAGNOSIS — I152 Hypertension secondary to endocrine disorders: Secondary | ICD-10-CM

## 2018-11-23 DIAGNOSIS — E1169 Type 2 diabetes mellitus with other specified complication: Secondary | ICD-10-CM

## 2018-11-23 DIAGNOSIS — I10 Essential (primary) hypertension: Secondary | ICD-10-CM | POA: Diagnosis not present

## 2018-11-23 LAB — BAYER DCA HB A1C WAIVED: HB A1C (BAYER DCA - WAIVED): 9.9 % — ABNORMAL HIGH (ref <7.0)

## 2018-11-23 NOTE — Progress Notes (Signed)
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 female presenting to clinic today for:  1. Type 2 Diabetes w/ HTN &HLD:  Patient reports: Glucometer:One touch Ultra.  She has only been injecting 20 units of Lantus every day.  She occasionally will give herself an extra 1 or 2 units if her blood sugars in the 2 or 300s.  On average, her fasting blood sugars have been between 150 and 200s.  She notes that the insulin seems to seep out.  She is tried using her thighs but she bleeds easier there.  She tends to use the right side of her abdomen as her injection site.  She does not move it much around in that area.  She is not compliant with the Lipitor as it "causes her to be nauseated".  She reports compliance with lisinopril 10 mg daily.  No chest pain, shortness of breath, dizziness, falls.  No polyuria, polydipsia or visual disturbance.  Last eye exam: 12/2017 Last foot exam: Up-to-date Last A1c:  Lab Results  Component Value Date   HGBA1C 11.2 (H) 08/24/2018   Nephropathy screen indicated?: on ACE-I Last flu, zoster and/or pneumovax:  Immunization History  Administered Date(s) Administered  . Influenza, High Dose Seasonal PF 10/20/2013, 11/03/2015, 10/01/2016, 10/25/2017  . Pneumococcal Conjugate-13 11/18/2013  . Pneumococcal Polysaccharide-23 08/18/2015  . Tdap 11/26/2010    ROS: Per HPI  Allergies  Allergen Reactions  . Codeine Nausea And Vomiting  . Iodine Hives  . Metformin And Related     Lactic acidosis  . Penicillins Other (See Comments)    Passed out  . Sulfa Antibiotics Nausea And Vomiting   Past Medical History:  Diagnosis Date  . Diabetes mellitus without complication ([place])   . Hyperlipidemia   . Hypertension     Current Outpatient Medications:  .  blood glucose meter kit and supplies, Dispense based on patient and insurance preference. Use up to four times daily as directed.  (FOR ICD-10 E10.9, E11.9)., Disp: 1 each, Rfl: 0 .  Blood Glucose Monitoring Suppl (ONE TOUCH ULTRA 2) w/Device KIT, Test blood sugars four times daily Dx E11.65, Disp: 1 kit, Rfl: 0 .  glucose blood (ONE TOUCH ULTRA TEST) test strip, Test 1 x pr day and prn, Disp: 100 each, Rfl: 12 .  Lancets (ONETOUCH ULTRASOFT) lancets, Test 1x per day and prn, Disp: 100 each, Rfl: 12 .  lisinopril (ZESTRIL) 10 MG tablet, TAKE 1 TABLET BY MOUTH  DAILY, Disp: 90 tablet, Rfl: 1 .  ONETOUCH DELICA LANCETS 66Y MISC, 1 daily and prn to check blood sugar, Disp: 100 each, Rfl: 3 .  atorvastatin (LIPITOR) 20 MG tablet, Take 1 tablet (20 mg total) by mouth daily. (Patient not taking: Reported on 11/23/2018), Disp: 90 tablet, Rfl: 3 .  Insulin Glargine (LANTUS SOLOSTAR) 100 UNIT/ML Solostar Pen, Inject 21-30 Units into the skin daily., Disp: 30 mL, Rfl: 0 Social History   Socioeconomic History  . Marital status: Married    Spouse name: Not on file  . Number of children: 2  . Years of education: 6  . Highest education level: 12th grade  Occupational History  . Not on file  Social Needs  . Financial resource strain: Not hard at all  . Food insecurity    Worry: Never true    Inability: Never true  . Transportation needs    Medical: No    Non-medical: No  Tobacco  Use  . Smoking status: Never Smoker  . Smokeless tobacco: Never Used  Substance and Sexual Activity  . Alcohol use: No  . Drug use: No  . Sexual activity: Not Currently  Lifestyle  . Physical activity    Days per week: 5 days    Minutes per session: 60 min  . Stress: To some extent  Relationships  . Social connections    Talks on phone: More than three times a week    Gets together: More than three times a week    Attends religious service: More than 4 times per year    Active member of club or organization: Yes    Attends meetings of clubs or organizations: More than 4 times per year    Relationship status: Married  . Intimate partner  violence    Fear of current or ex partner: No    Emotionally abused: No    Physically abused: No    Forced sexual activity: No  Other Topics Concern  . Not on file  Social History Narrative   Lives at home with her husband. He prepares most of their meals and they have a great relationship. She has one daughter and her son passed away a few years ago from complications with pneumonia. She works at [employer] in [place] over 40 hours a week. 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    Family History  Problem Relation Age of Onset  . Diabetes Father   . Heart disease Father   . Heart disease Sister   . COPD Sister   . Heart disease Sister   . Diabetes Son   . Pneumonia Son     Objective: Office vital signs reviewed. BP (!) 142/84   Pulse 79   Temp 98.1 F (36.7 C) (Oral)   Ht 5' 5"  (1.651 m)   Wt 173 lb (78.5 kg)   SpO2 98%   BMI 28.79 kg/m   Physical Examination:  General: Awake, alert, well nourished, No acute distress HEENT: Normal, sclera white, MMM Cardio: regular rate and rhythm, S1S2 heard, no murmurs appreciated Pulm: clear to auscultation bilaterally, no wheezes, rhonchi or rales; normal work of breathing on room air Extremities: warm, well perfused, No edema, cyanosis or clubbing; +2 pulses bilaterally MSK: normal gait and station Skin: dry; intact; no rashes or lesions   Assessment/ Plan: 78 y.o. female   1. Uncontrolled type 2 diabetes mellitus with hyperglycemia (HCC) A1c is improving but still uncontrolled at 9.9 today.  It appears that she has been having in adequate injection of her insulin.  We discussed moving the injection site to reduce risk of scarring and improve absorption of the insulin.  I again reiterated that she needs to increase her insulin by 1 unit every 2 days for fasting blood sugars above 150.  This was again reinforced in her AVS and examples were reviewed with the patient.  She was good understanding.   We also discussed alternative therapies including Januvia, Trulicity and Ozempic today.  Patient has quite a supply of insulin at home right now and would like to go ahead and use what she has before making any transitions for financial purposes.  This is reasonable.  She is to follow-up in 3 months, sooner if needed.  She is to schedule her diabetic eye exam.  She had her influenza vaccination in outside facility already - [facility] Hb A1c Waived  2. Hypertension associated with diabetes ([place]) Controlled for age.  Continue ACE inhibitor  3. Hyperlipidemia associated with type 2 diabetes mellitus ([place]) We discussed cutting the Lipitor in half and taking every night at bedtime.  Again she has an elevated ASCVD risk score and we discussed the implications of uncontrolled cholesterol and blood sugar.  She voiced good understanding. - LDL Cholesterol, Direct   No orders of the defined types were placed in this encounter.  No orders of the defined types were placed in this encounter.    [name], DO [place] [phone]

## 2018-11-23 NOTE — Patient Instructions (Addendum)
Sugar has come down some but is still too high.  Your goal A1c is 7.5.  It was 9.9 today.  We discussed breaking your Lipitor in half and taking at bedtime.  I think you need to stick with the cholesterol medicine as your risk of stroke and heart attack is very high.  Schedule your eye exam.  Increase you lantus to 21 units tonight. If your FASTING blood sugar is above 150 for 2 consecutive days, increase by 1 unit  Example: Day 1: Blood sugar was 159 Day 2: Blood sugar was 205  Increase Lantus to 22 units Day 3: Blood sugar is 202 Day 4: Blood sugars 168  Increase Lantus to 23 units Day 5: Blood sugar is 105 Day 6: Blood sugars 145  Stick with 23 units.  Do not increase.

## 2018-11-24 LAB — LDL CHOLESTEROL, DIRECT: LDL Direct: 167 mg/dL — ABNORMAL HIGH (ref 0–99)

## 2018-11-28 ENCOUNTER — Telehealth: Payer: Self-pay | Admitting: Family Medicine

## 2018-11-28 DIAGNOSIS — Z1283 Encounter for screening for malignant neoplasm of skin: Secondary | ICD-10-CM

## 2018-11-28 NOTE — Telephone Encounter (Signed)
I guess I am okay with doing a dermatology referral, diagnosis skin lesion?

## 2018-11-28 NOTE — Telephone Encounter (Signed)
Referral placed.

## 2018-11-28 NOTE — Telephone Encounter (Signed)
Patient is requesting a referral for dermatology for place on back, arm and face. [facility] please

## 2018-12-24 ENCOUNTER — Other Ambulatory Visit: Payer: Self-pay | Admitting: Family Medicine

## 2018-12-24 DIAGNOSIS — E1165 Type 2 diabetes mellitus with hyperglycemia: Secondary | ICD-10-CM

## 2018-12-26 ENCOUNTER — Ambulatory Visit (INDEPENDENT_AMBULATORY_CARE_PROVIDER_SITE_OTHER): Payer: Medicare Other | Admitting: *Deleted

## 2018-12-26 DIAGNOSIS — Z0000 Encounter for general adult medical examination without abnormal findings: Secondary | ICD-10-CM | POA: Diagnosis not present

## 2018-12-26 NOTE — Progress Notes (Addendum)
MEDICARE ANNUAL WELLNESS VISIT  12/26/2018  Telephone Visit Disclaimer This Medicare AWV was conducted by telephone due to national recommendations for restrictions regarding the COVID-19 Pandemic (e.g. social distancing).  I verified, using two identifiers, that I am speaking with Tami Villanueva or their authorized healthcare agent. I discussed the limitations, risks, security, and privacy concerns of performing an evaluation and management service by telephone and the potential availability of an in-person appointment in the future. The patient expressed understanding and agreed to proceed.   Subjective:  Tami Villanueva is a 78 y.o. female patient of [name], DO who had a Medicare Annual Wellness Visit today via telephone. Tami Villanueva is Working full time and lives with their spouse. she has 1daughter. . she reports that she is socially active and does interact with friends/family regularly. she is minimally physically active and enjoys shopping on and offlinel  Patient Care Team: [name], DO as PCP - General (Family Medicine) Starlyn Skeans as Physician Assistant (Dermatology)  Advanced Directives 12/26/2018 12/22/2017 09/30/2017  Does Patient Have a Medical Advance Directive? No No No  Would patient like information on creating a medical advance directive? Yes (MAU/Ambulatory/Procedural Areas - Information given) Yes (MAU/Ambulatory/Procedural Areas - Information given) -    Hospital Utilization Over the Past 12 Months: pp# of hospitalizations or ER visits: 0 # of surgeries: 0  Review of Systems    Patient reports that her overall health is unchanged compared to last year.  History obtained from chart review and the patient  Patient Reported Readings (BP, Pulse, CBG, Weight, etc) none  Pain Assessment Pain : No/denies pain     Current Medications & Allergies (verified) Allergies as of 12/26/2018      Reactions   Codeine Nausea And Vomiting   Iodine Hives   Metformin And Related    Lactic acidosis   Penicillins Other (See Comments)   Passed out   Sulfa Antibiotics Nausea And Vomiting      Medication List       Accurate as of December 26, 2018 11:07 AM. If you have any questions, ask your nurse or doctor.        atorvastatin 20 MG tablet Commonly known as: LIPITOR Take 1 tablet (20 mg total) by mouth daily.   blood glucose meter kit and supplies Dispense based on patient and insurance preference. Use up to four times daily as directed. (FOR ICD-10 E10.9, E11.9).   glucose blood test strip Commonly known as: ONE TOUCH ULTRA TEST Test 1 x pr day and prn   Lantus SoloStar 100 UNIT/ML Solostar Pen Generic drug: Insulin Glargine INJECT 21 TO 30 UNITS  SUBCUTANEOUSLY DAILY   lisinopril 10 MG tablet Commonly known as: ZESTRIL TAKE 1 TABLET BY MOUTH  DAILY   ONE TOUCH ULTRA 2 w/Device Kit Test blood sugars four times daily Dx J24.26   OneTouch Delica Lancets 83M Misc 1 daily and prn to check blood sugar   onetouch ultrasoft lancets Test 1x per day and prn       History (reviewed): Past Medical History:  Diagnosis Date  . Diabetes mellitus without complication ([place])   . Hyperlipidemia   . Hypertension    History reviewed. No pertinent surgical history. Family History  Problem Relation Age of Onset  . Diabetes Father   . Heart disease Father   . Heart disease Sister   . COPD Sister   . Heart disease Sister   . Diabetes Son   .  Pneumonia Son    Social History   Socioeconomic History  . Marital status: Married    Spouse name: Not on file  . Number of children: 2  . Years of education: 34  . Highest education level: 12th grade  Occupational History  . Occupation: employed  Scientific laboratory technician  . Financial resource strain: Not hard at all  . Food insecurity    Worry: Never true    Inability: Never true  . Transportation needs    Medical: No    Non-medical: No  Tobacco Use  . Smoking status:  Never Smoker  . Smokeless tobacco: Never Used  Substance and Sexual Activity  . Alcohol use: No  . Drug use: No  . Sexual activity: Not Currently  Lifestyle  . Physical activity    Days per week: 5 days    Minutes per session: 60 min  . Stress: Not at all  Relationships  . Social Herbalist on phone: Three times a week    Gets together: Never    Attends religious service: More than 4 times per year    Active member of club or organization: Yes    Attends meetings of clubs or organizations: More than 4 times per year    Relationship status: Married  Other Topics Concern  . Not on file  Social History Narrative   Lives at home with her husband. He prepares most of their meals and they have a great relationship. She has one daughter and her son passed away a few years ago from complications with pneumonia. She works at [employer] in [place] over 40 hours a week. 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     Activities of Daily Living In your present state of health, do you have any difficulty performing the following activities: 12/26/2018  Hearing? N  Vision? N  Difficulty concentrating or making decisions? N  Walking or climbing stairs? N  Dressing or bathing? N  Doing errands, shopping? N  Preparing Food and eating ? N  Using the Toilet? N  In the past six months, have you accidently leaked urine? N  Do you have problems with loss of bowel control? N  Managing your Medications? N  Managing your Finances? N  Housekeeping or managing your Housekeeping? N  Some recent data might be hidden    Patient Education/ Literacy How often do you need to have someone help you when you read instructions, pamphlets, or other written materials from your doctor or pharmacy?: 1 - Never What is the last grade level you completed in school?: 12  Exercise Current Exercise Habits: The patient has a physically strenuous job, but has no regular  exercise apart from work., Exercise limited by: None identified  Diet Patient reports consuming 3 meals a day and 2 snack(s) a day Patient reports that her primary diet is: Diabetic Patient reports that she does have regular access to food.   Depression Screen PHQ 2/9 Scores 12/26/2018 11/23/2018 08/24/2018 12/22/2017 10/04/2017 05/12/2017 01/05/2017  PHQ - 2 Score 0 0 0 0 0 0 0  PHQ- 9 Score 0 0 0 - - - -     Fall Risk Fall Risk  12/26/2018 11/23/2018 08/24/2018 12/22/2017 10/04/2017  Falls in the past year? 0 0 0 1 No  Comment - - - Tripped on clothes in the closet and fell on the chest of drawers -  Number falls in past yr: - - - 0 -  Injury with Fall? - - - 0 -  Risk for fall due to : - - - History of fall(s) -  Follow up - - - Falls prevention discussed -     Objective:  Tami Villanueva seemed alert and oriented and she participated appropriately during our telephone visit.  Blood Pressure Weight BMI  BP Readings from Last 3 Encounters:  11/23/18 (!) 142/84  08/24/18 (!) 156/91  12/22/17 135/82   Wt Readings from Last 3 Encounters:  11/23/18 173 lb (78.5 kg)  08/24/18 168 lb (76.2 kg)  12/22/17 172 lb (78 kg)   BMI Readings from Last 1 Encounters:  11/23/18 28.79 kg/m    *Unable to obtain current vital signs, weight, and BMI due to telephone visit type  Hearing/Vision  . Tami Villanueva did not seem to have difficulty with hearing/understanding during the telephone conversation . Reports that she has not had a formal eye exam by an eye care professional within the past year . Reports that she has not had a formal hearing evaluation within the past year *Unable to fully assess hearing and vision during telephone visit type  Cognitive Function: 6CIT Screen 12/26/2018 12/22/2017  What Year? 0 points 0 points  What month? 0 points 0 points  What time? 0 points 0 points  Count back from 20 0 points 0 points  Months in reverse 0 points 0 points  Repeat phrase 0 points 0 points   Total Score 0 0   (Normal:0-7, Significant for Dysfunction: >8)  Normal Cognitive Function Screening: Yes   Immunization & Health Maintenance Record Immunization History  Administered Date(s) Administered  . Influenza, High Dose Seasonal PF 10/20/2013, 11/03/2015, 10/01/2016, 10/25/2017, 09/25/2018  . Pneumococcal Conjugate-13 11/18/2013  . Pneumococcal Polysaccharide-23 08/18/2015  . Tdap 11/26/2010    Health Maintenance  Topic Date Due  . MAMMOGRAM  12/09/2018  . OPHTHALMOLOGY EXAM  12/23/2018  . HEMOGLOBIN A1C  05/24/2019  . FOOT EXAM  08/24/2019  . TETANUS/TDAP  11/25/2020  . INFLUENZA VACCINE  Completed  . DEXA SCAN  Completed  . PNA vac Low Risk Adult  Completed       Assessment  This is a routine wellness examination for Tami Villanueva.  Health Maintenance: Due or Overdue Health Maintenance Due  Topic Date Due  . MAMMOGRAM  12/09/2018  . OPHTHALMOLOGY EXAM  12/23/2018    Tami Villanueva does not need a referral for Community Assistance: Care Management:   no Social Work:    no Prescription Assistance:  no Nutrition/Diabetes Education:  no   Plan:  Personalized Goals Goals Addressed            This Visit's Progress   . DIET - INCREASE WATER INTAKE   On track   . COMPLETED: Exercise 150 min/wk Moderate Activity       Pt is 78 years of age and still works 40 hours a week, according to her this is goal enough!      Personalized Health Maintenance & Screening Recommendations  Screening mammography Advanced directives: copies placed up front for pick up diabetic eye exam  Lung Cancer Screening Recommended: no (Low Dose CT Chest recommended if Age 86-80 years, 30 pack-year currently smoking OR have quit w/in past 15 years) Hepatitis C Screening recommended: no HIV Screening recommended: no  Advanced Directives: Written information was prepared per patient's request.  Referrals & Orders No orders of the defined types were placed in this encounter.    Follow-up Plan . Follow-up with  Ronnie Doss M, DO as planned . Scheduled Mammogram on 01/22/19 . Advanced directives placed up front for pick up per patients request. . Pt will schedule diabetic eye exam very soon per pt.    I have personally reviewed and noted the following in the patient's chart:   . Medical and social history . Use of alcohol, tobacco or illicit drugs  . Current medications and supplements . Functional ability and status . Nutritional status . Physical activity . Advanced directives . List of other physicians . Hospitalizations, surgeries, and ER visits in previous 12 months . Vitals . Screenings to include cognitive, depression, and falls . Referrals and appointments  In addition, I have reviewed and discussed with Tami Villanueva certain preventive protocols, quality metrics, and best practice recommendations. A written personalized care plan for preventive services as well as general preventive health recommendations is available and can be mailed to the patient at her request.      Rana Snare, LPN  [id number]   I have reviewed and agree with the above AWV documentation.   Evelina Dun, FNP

## 2019-03-01 ENCOUNTER — Ambulatory Visit: Payer: Medicare Other | Admitting: Family Medicine

## 2019-04-03 DIAGNOSIS — L570 Actinic keratosis: Secondary | ICD-10-CM | POA: Diagnosis not present

## 2019-04-03 DIAGNOSIS — C44629 Squamous cell carcinoma of skin of left upper limb, including shoulder: Secondary | ICD-10-CM | POA: Diagnosis not present

## 2019-04-03 DIAGNOSIS — C4492 Squamous cell carcinoma of skin, unspecified: Secondary | ICD-10-CM

## 2019-04-03 HISTORY — DX: Squamous cell carcinoma of skin, unspecified: C44.92

## 2019-04-09 ENCOUNTER — Other Ambulatory Visit: Payer: Self-pay | Admitting: Family Medicine

## 2019-04-09 DIAGNOSIS — I10 Essential (primary) hypertension: Secondary | ICD-10-CM

## 2019-04-18 ENCOUNTER — Other Ambulatory Visit: Payer: Self-pay

## 2019-04-19 ENCOUNTER — Encounter: Payer: Self-pay | Admitting: Family Medicine

## 2019-04-19 ENCOUNTER — Ambulatory Visit (INDEPENDENT_AMBULATORY_CARE_PROVIDER_SITE_OTHER): Payer: Medicare Other | Admitting: Family Medicine

## 2019-04-19 VITALS — BP 144/84 | HR 91 | Temp 95.0°F | Ht 65.0 in | Wt 160.8 lb

## 2019-04-19 DIAGNOSIS — E1169 Type 2 diabetes mellitus with other specified complication: Secondary | ICD-10-CM | POA: Diagnosis not present

## 2019-04-19 DIAGNOSIS — I10 Essential (primary) hypertension: Secondary | ICD-10-CM

## 2019-04-19 DIAGNOSIS — E785 Hyperlipidemia, unspecified: Secondary | ICD-10-CM | POA: Diagnosis not present

## 2019-04-19 DIAGNOSIS — E1165 Type 2 diabetes mellitus with hyperglycemia: Secondary | ICD-10-CM

## 2019-04-19 DIAGNOSIS — E1159 Type 2 diabetes mellitus with other circulatory complications: Secondary | ICD-10-CM

## 2019-04-19 DIAGNOSIS — I152 Hypertension secondary to endocrine disorders: Secondary | ICD-10-CM

## 2019-04-19 LAB — BAYER DCA HB A1C WAIVED: HB A1C (BAYER DCA - WAIVED): >14 % — ABNORMAL HIGH (ref <7.0)

## 2019-04-19 MED ORDER — ATORVASTATIN CALCIUM 20 MG PO TABS: 20.0000 mg | ORAL_TABLET | Freq: Every day | ORAL | 3 refills | Status: DC

## 2019-04-19 NOTE — Progress Notes (Signed)
Subjective: CC: Type 2 diabetes with hypertension and hyperlipidemia PCP: [name], DO MOQ:HUTML Tami Villanueva is a 78 y.o. female presenting to clinic today for:  1. Type 2 Diabetes w/ HTN &HLD:  Patient reports: Glucometer:One touch Ultra.  She has only been injecting 20 units of Lantus every day.  She occasionally will give herself an extra 1 or 2 units if her blood sugars are elevated she goes on to admit that she in fact does not monitor her blood sugars regularly.  She notes weight loss, which is unplanned but she is happy about.  She has dropped 2 dress sizes "without trying".  She does not monitor her diet and eats quite a bit of carbohydrates after further review.  Meals typically consist of toast, biscuits, oatmeal, potatoes.  Denies any chest pain, shortness of breath, polyuria, polydipsia, visual disturbance.  Last eye exam: UTD Last foot exam: Up-to-date Last A1c:  Lab Results  Component Value Date   HGBA1C 9.9 (H) 11/23/2018   Nephropathy screen indicated?: on ACE-I Last flu, zoster and/or pneumovax:  Immunization History  Administered Date(s) Administered  . Influenza, High Dose Seasonal PF 10/20/2013, 11/03/2015, 10/01/2016, 10/25/2017, 09/25/2018  . Moderna SARS-COVID-2 Vaccination 04/14/2019  . Pneumococcal Conjugate-13 11/18/2013  . Pneumococcal Polysaccharide-23 08/18/2015  . Tdap 11/26/2010    ROS: Per HPI  Allergies  Allergen Reactions  . Codeine Nausea And Vomiting  . Iodine Hives  . Metformin And Related     Lactic acidosis  . Penicillins Other (See Comments)    Passed out  . Sulfa Antibiotics Nausea And Vomiting   Past Medical History:  Diagnosis Date  . Diabetes mellitus without complication ([place])   . Hyperlipidemia   . Hypertension   . SCCA (squamous cell carcinoma) of skin 04/03/2019   KA TOP LEFT SHOULDER TX P BX    Current Outpatient Medications:  .  atorvastatin (LIPITOR) 20 MG tablet, Take 1 tablet (20 mg total) by mouth daily.,  Disp: 90 tablet, Rfl: 3 .  blood glucose meter kit and supplies, Dispense based on patient and insurance preference. Use up to four times daily as directed. (FOR ICD-10 E10.9, E11.9)., Disp: 1 each, Rfl: 0 .  Blood Glucose Monitoring Suppl (ONE TOUCH ULTRA 2) w/Device KIT, Test blood sugars four times daily Dx E11.65, Disp: 1 kit, Rfl: 0 .  glucose blood (ONE TOUCH ULTRA TEST) test strip, Test 1 x pr day and prn, Disp: 100 each, Rfl: 12 .  Lancets (ONETOUCH ULTRASOFT) lancets, Test 1x per day and prn, Disp: 100 each, Rfl: 12 .  LANTUS SOLOSTAR 100 UNIT/ML Solostar Pen, INJECT 21 TO 30 UNITS  SUBCUTANEOUSLY DAILY, Disp: 30 mL, Rfl: 3 .  lisinopril (ZESTRIL) 10 MG tablet, TAKE 1 TABLET BY MOUTH  DAILY, Disp: 90 tablet, Rfl: 3 .  ONETOUCH DELICA LANCETS 46T MISC, 1 daily and prn to check blood sugar, Disp: 100 each, Rfl: 3 Social History   Socioeconomic History  . Marital status: Married    Spouse name: Not on file  . Number of children: 2  . Years of education: 51  . Highest education level: 12th grade  Occupational History  . Occupation: employed  Tobacco Use  . Smoking status: Never Smoker  . Smokeless tobacco: Never Used  Substance and Sexual Activity  . Alcohol use: No  . Drug use: No  . Sexual activity: Not Currently  Other Topics Concern  . Not on file  Social History Narrative   Lives at home with  her husband. He prepares most of their meals and they have a great relationship. She has one daughter and her son passed away a few years ago from complications with pneumonia. She works at [employer] in [place] over 40 hours a week. 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    Social Determinants of Health   Financial Resource Strain:   . Difficulty of Paying Living Expenses:   Food Insecurity:   . Worried About Charity fundraiser in the Last Year:   . Arboriculturist in the Last Year:   Transportation Needs:   . Film/video editor  (Medical):   Marland Kitchen Lack of Transportation (Non-Medical):   Physical Activity:   . Days of Exercise per Week:   . Minutes of Exercise per Session:   Stress: No Stress Concern Present  . Feeling of Stress : Not at all  Social Connections: Unknown  . Frequency of Communication with Friends and Family: Three times a week  . Frequency of Social Gatherings with Friends and Family: Never  . Attends Religious Services: Not on file  . Active Member of Clubs or Organizations: Not on file  . Attends Archivist Meetings: Not on file  . Marital Status: Not on file  Intimate Partner Violence:   . Fear of Current or Ex-Partner:   . Emotionally Abused:   Marland Kitchen Physically Abused:   . Sexually Abused:    Family History  Problem Relation Age of Onset  . Diabetes Father   . Heart disease Father   . Heart disease Sister   . COPD Sister   . Heart disease Sister   . Diabetes Son   . Pneumonia Son     Objective: Office vital signs reviewed. BP (!) 144/84   Pulse 91   Temp (!) 95 F (35 C) (Temporal)   Ht '5\' 5"'$  (1.651 m)   Wt 160 lb 12.8 oz (72.9 kg)   SpO2 96%   BMI 26.76 kg/m   Physical Examination:  General: Awake, alert, well nourished, No acute distress HEENT: Normal, sclera white, MMM Cardio: regular rate and rhythm, S1S2 heard, no murmurs appreciated Pulm: clear to auscultation bilaterally, no wheezes, rhonchi or rales; normal work of breathing on room air Extremities: warm, well perfused, No edema, cyanosis or clubbing; +2 pulses bilaterally MSK: normal gait and station Skin: dry; intact; no rashes or lesions  Assessment/ Plan: 79 y.o. female   1. Uncontrolled type 2 diabetes mellitus with hyperglycemia (HCC) A1c greater than 14 today.  I have a high suspicion that she is in a catabolic state given uncontrolled blood sugars.  I placed a referral to endocrinology for assistance.  I again reinforced that she should be increasing her Lantus by 1 unit every 2 days for fasting  blood sugars above 150.  We will try and see if we can set up a freestyle libre as she identified difficulty getting blood from her fingers as a barrier to checking her blood sugars. - Bayer DCA Hb A1c Waived - Ambulatory referral to Endocrinology  2. Hypertension associated with diabetes ([place]) Controlled - CMP14+EGFR  3. Hyperlipidemia associated with type 2 diabetes mellitus ([place]) - Lipid Panel - CMP14+EGFR   Orders Placed This Encounter  Procedures  . Lipid Panel  . CMP14+EGFR  . Bayer DCA Hb A1c Waived   No orders of the defined types were placed in this encounter.    [name], DO [place] [phone])  [phone]   

## 2019-04-19 NOTE — Patient Instructions (Signed)
I am referring you to the sugar doctor, Dr Dorris Fetch, in [place].  I will have Cyril Mourning call you to set up the [facility].   I want you checking your sugar EVERY morning, and 2 hours after EVERY meal.  Increase you lantus to 22 units tonight. If your FASTING blood sugar is above 150 for 2 consecutive days, increase by 1 unit  Example: Day 1: Blood sugar was 159 Day 2: Blood sugar was 205           Increase Lantus to 22 units Day 3: Blood sugar is 202 Day 4: Blood sugars 168           Increase Lantus to 23 units Day 5: Blood sugar is 105 Day 6: Blood sugars 145           Stick with 23 units.  Do not increase.

## 2019-04-20 LAB — LIPID PANEL
Chol/HDL Ratio: 3.7 ratio (ref 0.0–4.4)
Cholesterol, Total: 165 mg/dL (ref 100–199)
HDL: 45 mg/dL (ref >39)
LDL Chol Calc (NIH): 94 mg/dL (ref 0–99)
Triglycerides: 147 mg/dL (ref 0–149)
VLDL Cholesterol Cal: 26 mg/dL (ref 5–40)

## 2019-04-20 LAB — CMP14+EGFR
ALT: 17 IU/L (ref 0–32)
AST: 20 IU/L (ref 0–40)
Albumin/Globulin Ratio: 1.7 (ref 1.2–2.2)
Albumin: 4.6 g/dL (ref 3.7–4.7)
Alkaline Phosphatase: 131 IU/L — ABNORMAL HIGH (ref 39–117)
BUN/Creatinine Ratio: 10 — ABNORMAL LOW (ref 12–28)
BUN: 10 mg/dL (ref 8–27)
Bilirubin Total: 0.5 mg/dL (ref 0.0–1.2)
CO2: 22 mmol/L (ref 20–29)
Calcium: 9.6 mg/dL (ref 8.7–10.3)
Chloride: 98 mmol/L (ref 96–106)
Creatinine, Ser: 1.01 mg/dL — ABNORMAL HIGH (ref 0.57–1.00)
GFR calc Af Amer: 62 mL/min/{1.73_m2} (ref >59)
GFR calc non Af Amer: 54 mL/min/{1.73_m2} — ABNORMAL LOW (ref >59)
Globulin, Total: 2.7 g/dL (ref 1.5–4.5)
Glucose: 311 mg/dL — ABNORMAL HIGH (ref 65–99)
Potassium: 4 mmol/L (ref 3.5–5.2)
Sodium: 136 mmol/L (ref 134–144)
Total Protein: 7.3 g/dL (ref 6.0–8.5)

## 2019-04-22 ENCOUNTER — Telehealth: Payer: Self-pay | Admitting: Family Medicine

## 2019-04-22 NOTE — Chronic Care Management (AMB) (Signed)
  Chronic Care Management   Note  04/22/2019 Name: Tami Villanueva MRN: [id number] DOB: 06/02/1940  Tami Villanueva is a 79 y.o. year old female who is a primary care patient of [name], DO. I reached out to Melanie Crazier by phone today in response to a referral sent by Ms. Genee M Heatherington's PCP,  Ronnie Doss DO     Ms. Durfee was given information about Chronic Care Management services today including:  1. CCM service includes personalized support from designated clinical staff supervised by her physician, including individualized plan of care and coordination with other care providers 2. 24/7 contact phone numbers for assistance for urgent and routine care needs. 3. Service will only be billed when office clinical staff spend 20 minutes or more in a month to coordinate care. 4. Only one practitioner may furnish and bill the service in a calendar month. 5. The patient may stop CCM services at any time (effective at the end of the month) by phone call to the office staff. 6. The patient will be responsible for cost sharing (co-pay) of up to 20% of the service fee (after annual deductible is met).  Patient did not agree to enrollment in care management services and does not wish to consider at this time.  Follow up plan: The care management team is available to follow up with the patient after provider conversation with the patient regarding recommendation for care management engagement and subsequent re-referral to the care management team.   Glenna Durand, LPN Health Advisor, [facility] ??[email or web address] ??[phone]

## 2019-05-15 ENCOUNTER — Ambulatory Visit: Payer: Medicare Other | Admitting: Family Medicine

## 2019-06-12 ENCOUNTER — Ambulatory Visit: Payer: Medicare Other | Admitting: "Endocrinology

## 2019-07-04 ENCOUNTER — Ambulatory Visit (INDEPENDENT_AMBULATORY_CARE_PROVIDER_SITE_OTHER): Payer: Medicare Other | Admitting: Family Medicine

## 2019-07-04 ENCOUNTER — Other Ambulatory Visit: Payer: Self-pay

## 2019-07-04 ENCOUNTER — Encounter: Payer: Self-pay | Admitting: Family Medicine

## 2019-07-04 VITALS — BP 122/67 | HR 87 | Temp 97.5°F | Ht 65.0 in | Wt 159.8 lb

## 2019-07-04 DIAGNOSIS — E785 Hyperlipidemia, unspecified: Secondary | ICD-10-CM

## 2019-07-04 DIAGNOSIS — E1169 Type 2 diabetes mellitus with other specified complication: Secondary | ICD-10-CM | POA: Diagnosis not present

## 2019-07-04 DIAGNOSIS — E1159 Type 2 diabetes mellitus with other circulatory complications: Secondary | ICD-10-CM

## 2019-07-04 DIAGNOSIS — I10 Essential (primary) hypertension: Secondary | ICD-10-CM

## 2019-07-04 DIAGNOSIS — Z9119 Patient's noncompliance with other medical treatment and regimen: Secondary | ICD-10-CM

## 2019-07-04 DIAGNOSIS — E1165 Type 2 diabetes mellitus with hyperglycemia: Secondary | ICD-10-CM | POA: Diagnosis not present

## 2019-07-04 DIAGNOSIS — Z91199 Patient's noncompliance with other medical treatment and regimen due to unspecified reason: Secondary | ICD-10-CM

## 2019-07-04 DIAGNOSIS — I152 Hypertension secondary to endocrine disorders: Secondary | ICD-10-CM

## 2019-07-04 LAB — BAYER DCA HB A1C WAIVED: HB A1C (BAYER DCA - WAIVED): 12.5 % — ABNORMAL HIGH (ref <7.0)

## 2019-07-04 MED ORDER — LANTUS SOLOSTAR 100 UNIT/ML ~~LOC~~ SOPN: PEN_INJECTOR | SUBCUTANEOUS | 3 refills | Status: DC

## 2019-07-04 MED ORDER — LINAGLIPTIN 5 MG PO TABS: 5.0000 mg | ORAL_TABLET | Freq: Every day | ORAL | 0 refills | Status: DC

## 2019-07-04 NOTE — Addendum Note (Signed)
Addended by: [name] on: 07/04/2019 06:51 PM   Modules accepted: Orders

## 2019-07-04 NOTE — Progress Notes (Signed)
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 female presenting to clinic today for:  1. Uncontrolled Type 2 Diabetes w/ HTN &HLD:  Patient reports she has been having a harder and harder time getting blood out of her finger due to callus.  She tries to monitor her blood sugars and they continue to be quite high.  She is still only injecting Lantus 22 units nightly.  Sometimes she will do twice daily if her blood sugar is high.  However, she has not been increasing by 1 unit every 2 days for fasting blood sugars above 150 as we discussed on her previous visit.  She canceled the appointment with Dr. Lum Babe because "they sounded hateful on the phone".  She denies being symptomatic.  She has been trying to cut back diet wise.  She has quit one of her jobs and she does feel that her stress level has lessened.  She is compliant with her atorvastatin and lisinopril.  No chest pain, shortness of breath, visual disturbance or falls.  Denies any family history of MEN tumors, thyroid cancers.  She was admitted for DKA when she was first diagnosed with diabetes many years ago but none since then.  She is intolerant to Metformin but would be interested in starting a pill if possible.  Last eye exam: UTD Last foot exam: Up-to-date Last A1c:  Lab Results  Component Value Date   HGBA1C >14.0 (H) 04/19/2019   Nephropathy screen indicated?: on ACE-I Last flu, zoster and/or pneumovax:  Immunization History  Administered Date(s) Administered  . Influenza, High Dose Seasonal PF 10/20/2013, 11/03/2015, 10/01/2016, 10/25/2017, 09/25/2018  . Moderna SARS-COVID-2 Vaccination 04/14/2019  . Pneumococcal Conjugate-13 11/18/2013  . Pneumococcal Polysaccharide-23 08/18/2015  . Tdap 11/26/2010    ROS: Per HPI  Allergies  Allergen Reactions  . Codeine Nausea And Vomiting  . Iodine Hives  . Metformin And Related     Lactic acidosis    . Penicillins Other (See Comments)    Passed out  . Sulfa Antibiotics Nausea And Vomiting   Past Medical History:  Diagnosis Date  . Diabetes mellitus without complication ([place])   . Hyperlipidemia   . Hypertension   . SCCA (squamous cell carcinoma) of skin 04/03/2019   KA TOP LEFT SHOULDER TX P BX    Current Outpatient Medications:  .  atorvastatin (LIPITOR) 20 MG tablet, Take 1 tablet (20 mg total) by mouth daily., Disp: 90 tablet, Rfl: 3 .  blood glucose meter kit and supplies, Dispense based on patient and insurance preference. Use up to four times daily as directed. (FOR ICD-10 E10.9, E11.9)., Disp: 1 each, Rfl: 0 .  Blood Glucose Monitoring Suppl (ONE TOUCH ULTRA 2) w/Device KIT, Test blood sugars four times daily Dx E11.65, Disp: 1 kit, Rfl: 0 .  glucose blood (ONE TOUCH ULTRA TEST) test strip, Test 1 x pr day and prn, Disp: 100 each, Rfl: 12 .  Lancets (ONETOUCH ULTRASOFT) lancets, Test 1x per day and prn, Disp: 100 each, Rfl: 12 .  LANTUS SOLOSTAR 100 UNIT/ML Solostar Pen, INJECT 21 TO 30 UNITS  SUBCUTANEOUSLY DAILY, Disp: 30 mL, Rfl: 3 .  lisinopril (ZESTRIL) 10 MG tablet, TAKE 1 TABLET BY MOUTH  DAILY, Disp: 90 tablet, Rfl: 3 .  ONETOUCH DELICA LANCETS 73X MISC, 1 daily and prn to check blood sugar, Disp: 100 each, Rfl: 3 Social History   Socioeconomic History  . Marital status: Married  Spouse name: Not on file  . Number of children: 2  . Years of education: 46  . Highest education level: 12th grade  Occupational History  . Occupation: employed  Tobacco Use  . Smoking status: Never Smoker  . Smokeless tobacco: Never Used  Substance and Sexual Activity  . Alcohol use: No  . Drug use: No  . Sexual activity: Not Currently  Other Topics Concern  . Not on file  Social History Narrative   Lives at home with her husband. He prepares most of their meals and they have a great relationship. She has one daughter and her son passed away a few years ago from complications  with pneumonia. She works at [employer] in [place] over 40 hours a week. 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    Social Determinants of Health   Financial Resource Strain:   . Difficulty of Paying Living Expenses:   Food Insecurity:   . Worried About Charity fundraiser in the Last Year:   . Arboriculturist in the Last Year:   Transportation Needs:   . Film/video editor (Medical):   Marland Kitchen Lack of Transportation (Non-Medical):   Physical Activity:   . Days of Exercise per Week:   . Minutes of Exercise per Session:   Stress: No Stress Concern Present  . Feeling of Stress : Not at all  Social Connections: Unknown  . Frequency of Communication with Friends and Family: Three times a week  . Frequency of Social Gatherings with Friends and Family: Never  . Attends Religious Services: Not on file  . Active Member of Clubs or Organizations: Not on file  . Attends Archivist Meetings: Not on file  . Marital Status: Not on file  Intimate Partner Violence:   . Fear of Current or Ex-Partner:   . Emotionally Abused:   Marland Kitchen Physically Abused:   . Sexually Abused:    Family History  Problem Relation Age of Onset  . Diabetes Father   . Heart disease Father   . Heart disease Sister   . COPD Sister   . Heart disease Sister   . Diabetes Son   . Pneumonia Son     Objective: Office vital signs reviewed. BP 122/67   Pulse 87   Temp (!) 97.5 F (36.4 C)   Ht 5' 5" (1.651 m)   Wt 159 lb 12.8 oz (72.5 kg)   SpO2 96%   BMI 26.59 kg/m   Physical Examination:  General: Awake, alert, well nourished, No acute distress HEENT: Normal, sclera white, MMM Cardio: regular rate and rhythm  Pulm: normal work of breathing on room air. Extremities: warm, well perfused, No edema, cyanosis or clubbing; +2 pulses bilaterally MSK: normal gait and station  Assessment/ Plan: 79 y.o. female   1. Uncontrolled type 2 diabetes mellitus with hyperglycemia  (HCC) A1c continues to be high at 12.5 though slightly reduced from her greater than 14 A1c that was noted at last visit.  Unfortunately, she is not been very compliant with the recommendations of increasing her basal insulin by 1 unit every 2 days for fasting blood sugar above 150.  I am unsure as to what her barriers to this has been.  We discussed the dangers of spontaneously doubling her dose and the risk of a life-threatening hypoglycemic episode.  Unfortunately did not have any meters to sample to her today but I did give her written prescription for One Touch Viva  which would require less of blood.  We discussed consideration for freestyle libre but given her being on Medicare, I highly doubt that they would cover it without her having checked her blood sugar 4 times daily or being on at least 2 insulins.  I discussed this with her.  Trial of Tradjenta.  We discussed the likelihood of needing to increase this to 10 mg.  I again reinforced that she needs to increase her basal insulin by 1 unit every 2 days for fasting blood sugars above 150.  This is reiterated in her AVS.  I would like to see her back in about a month to review her blood sugar log and see how she is tolerating her medication - linagliptin (TRADJENTA) 5 MG TABS tablet; Take 1 tablet (5 mg total) by mouth daily.  Dispense: 28 tablet; Refill: 0 - Bayer DCA Hb A1c Waived  2. Hypertension associated with diabetes ([place]) Well-controlled - Basic Metabolic Panel  3. Hyperlipidemia associated with type 2 diabetes mellitus (HCC) Continue statin - Lipid Panel  4. Noncompliance As above.   No orders of the defined types were placed in this encounter.  No orders of the defined types were placed in this encounter.    [name], DO [place] [phone]

## 2019-07-04 NOTE — Patient Instructions (Addendum)
I have added Tradjenta.  Take 1 tablet daily.  See me in 4 weeks for review of your blood sugar log.  I have given you a prescription for the One Touch Verio machine which requires less blood than your current machine.  If your FASTING blood sugar is above 150 for 2 consecutive days, increase by 1 unit of your long acting insulin.  Example: Day 1: Blood sugar was 159 Day 2: Blood sugar was 205  Increase Lantus to 22 units Day 3: Blood sugar is 202 Day 4: Blood sugars 168  Increase Lantus to 23 units Day 5: Blood sugar is 105 Day 6: Blood sugars 145  Stick with 23 units.  Do not increase.

## 2019-07-05 ENCOUNTER — Other Ambulatory Visit: Payer: Self-pay | Admitting: *Deleted

## 2019-07-05 LAB — LIPID PANEL
Chol/HDL Ratio: 3.2 ratio (ref 0.0–4.4)
Cholesterol, Total: 167 mg/dL (ref 100–199)
HDL: 52 mg/dL (ref >39)
LDL Chol Calc (NIH): 94 mg/dL (ref 0–99)
Triglycerides: 120 mg/dL (ref 0–149)
VLDL Cholesterol Cal: 21 mg/dL (ref 5–40)

## 2019-07-05 LAB — BASIC METABOLIC PANEL
BUN/Creatinine Ratio: 14 (ref 12–28)
BUN: 11 mg/dL (ref 8–27)
CO2: 23 mmol/L (ref 20–29)
Calcium: 9.5 mg/dL (ref 8.7–10.3)
Chloride: 103 mmol/L (ref 96–106)
Creatinine, Ser: 0.81 mg/dL (ref 0.57–1.00)
GFR calc Af Amer: 82 mL/min/{1.73_m2} (ref >59)
GFR calc non Af Amer: 71 mL/min/{1.73_m2} (ref >59)
Glucose: 139 mg/dL — ABNORMAL HIGH (ref 65–99)
Potassium: 3.9 mmol/L (ref 3.5–5.2)
Sodium: 141 mmol/L (ref 134–144)

## 2019-07-09 ENCOUNTER — Other Ambulatory Visit: Payer: Self-pay

## 2019-07-09 MED ORDER — ONETOUCH DELICA LANCETS 33G MISC: 11 refills | Status: DC

## 2019-07-15 ENCOUNTER — Telehealth: Payer: Self-pay | Admitting: Family Medicine

## 2019-07-15 NOTE — Telephone Encounter (Signed)
Patient aware of lab results.

## 2019-07-15 NOTE — Progress Notes (Signed)
CALLED PATIENT, NO ANSWER, LEFT MESSAGE TO RETURN CALL 

## 2019-07-17 ENCOUNTER — Ambulatory Visit (INDEPENDENT_AMBULATORY_CARE_PROVIDER_SITE_OTHER): Payer: Medicare Other | Admitting: Pharmacist

## 2019-07-17 DIAGNOSIS — E1165 Type 2 diabetes mellitus with hyperglycemia: Secondary | ICD-10-CM

## 2019-07-17 NOTE — Progress Notes (Signed)
    07/17/2019 Name: CICLEY GANESH MRN: [id number] DOB: 03-23-1940  No answer from patient's home or mobile number.  Will follow up at a later date   Regina Eck, PharmD, BCPS Clinical Pharmacist, [place]  [facility] Phone [phone]

## 2019-07-23 ENCOUNTER — Telehealth: Payer: Self-pay | Admitting: Family Medicine

## 2019-07-23 ENCOUNTER — Other Ambulatory Visit: Payer: Self-pay | Admitting: Family Medicine

## 2019-07-23 DIAGNOSIS — E1165 Type 2 diabetes mellitus with hyperglycemia: Secondary | ICD-10-CM

## 2019-07-23 MED ORDER — LINAGLIPTIN 5 MG PO TABS: 5.0000 mg | ORAL_TABLET | Freq: Every day | ORAL | 0 refills | Status: DC

## 2019-07-23 NOTE — Telephone Encounter (Signed)
I'm so glad to hear this.  I have sent in Trajenta to her mail order and have placed 2 weeks of samples up front to get her through while she waits for her order to come in.

## 2019-07-23 NOTE — Telephone Encounter (Signed)
Patient aware.

## 2019-07-23 NOTE — Telephone Encounter (Signed)
See below.  Patient is referring to Tradjenta 5 mg.

## 2019-07-23 NOTE — Telephone Encounter (Signed)
Pt calling and states her blood sugar levels have been good and that today it read 106. Pt states she is almost out of the medication that she was given for a trial period for her sugar and is wanting to know if she needs a refill or wait until 6/30 to discuss with Dr. Lajuana Ripple. Pt unaware of the name of the medication and does not have it with her at this time.

## 2019-07-30 ENCOUNTER — Ambulatory Visit: Payer: Medicare Other | Admitting: Pharmacist

## 2019-07-30 ENCOUNTER — Telehealth: Payer: Self-pay | Admitting: Pharmacist

## 2019-07-30 NOTE — Telephone Encounter (Signed)
VM left for patient to return call Purpose of call: checking in on DM medications, BGs, etc

## 2019-08-07 ENCOUNTER — Other Ambulatory Visit: Payer: Self-pay

## 2019-08-07 ENCOUNTER — Encounter: Payer: Self-pay | Admitting: Family Medicine

## 2019-08-07 ENCOUNTER — Ambulatory Visit (INDEPENDENT_AMBULATORY_CARE_PROVIDER_SITE_OTHER): Payer: Medicare Other | Admitting: Family Medicine

## 2019-08-07 VITALS — BP 117/79 | HR 78 | Temp 97.8°F | Ht 65.0 in | Wt 161.0 lb

## 2019-08-07 DIAGNOSIS — E785 Hyperlipidemia, unspecified: Secondary | ICD-10-CM | POA: Diagnosis not present

## 2019-08-07 DIAGNOSIS — E1169 Type 2 diabetes mellitus with other specified complication: Secondary | ICD-10-CM

## 2019-08-07 DIAGNOSIS — E1165 Type 2 diabetes mellitus with hyperglycemia: Secondary | ICD-10-CM | POA: Diagnosis not present

## 2019-08-07 DIAGNOSIS — B372 Candidiasis of skin and nail: Secondary | ICD-10-CM | POA: Diagnosis not present

## 2019-08-07 MED ORDER — LINAGLIPTIN 5 MG PO TABS: 5.0000 mg | ORAL_TABLET | Freq: Every day | ORAL | 0 refills | Status: DC

## 2019-08-07 MED ORDER — NYSTATIN 100000 UNIT/GM EX POWD: 1.0000 "application " | Freq: Three times a day (TID) | CUTANEOUS | 0 refills | Status: DC

## 2019-08-07 NOTE — Progress Notes (Signed)
Subjective: CC: Type 2 diabetes with hypertension and hyperlipidemia PCP: [name], DO UYQ:IHKVQ Tami Villanueva is a 79 y.o. female presenting to clinic today for:  1. Uncontrolled Type 2 Diabetes w/ HTN &HLD:  Patient with persistently uncontrolled blood sugars at last visit.  She was self bolusing insulin without any specific instructions to do so.  I had counseled her on injecting 1 additional unit per 2 days of blood sugars above 150 until she can get blood sugars under 150 and then staying at that level.  Tradjenta 5 mg daily was started.  She was prescribed a new meter because she was having difficulty with fingersticks.  She is here for 4-week follow-up.  She brings her blood sugar log in today and blood sugars have been trending down very nicely.  She started in the 300s and has gradually come down to blood sugars below 150 every morning.  She is tolerating the Tradjenta without difficulty.  She is currently injecting 23 units only at bedtime of her long-acting insulin.  Does not report any chest pain, shortness of breath, dizziness, polyuria, polydipsia, vaginitis or dysuria.  Last eye exam: UTD Last foot exam: Up-to-date Last A1c:  Lab Results  Component Value Date   HGBA1C 12.5 (H) 07/04/2019   Nephropathy screen indicated?: on ACE-I Last flu, zoster and/or pneumovax:  Immunization History  Administered Date(s) Administered  . Influenza, High Dose Seasonal PF 10/20/2013, 11/03/2015, 10/01/2016, 10/25/2017, 09/25/2018  . Moderna SARS-COVID-2 Vaccination 04/14/2019  . Pneumococcal Conjugate-13 11/18/2013  . Pneumococcal Polysaccharide-23 08/18/2015  . Tdap 11/26/2010   2.  Breast rash Patient reports couple month history of rash underneath her breast.  Over the last several days it became irritated and had welts.  She applied an over-the-counter herbal cream which has improved the welts but she would like to get a prescription for the powder.  ROS: Per HPI  Allergies    Allergen Reactions  . Codeine Nausea And Vomiting  . Iodine Hives  . Metformin And Related     Lactic acidosis  . Penicillins Other (See Comments)    Passed out  . Sulfa Antibiotics Nausea And Vomiting   Past Medical History:  Diagnosis Date  . Diabetes mellitus without complication ([place])   . Hyperlipidemia   . Hypertension   . SCCA (squamous cell carcinoma) of skin 04/03/2019   KA TOP LEFT SHOULDER TX P BX    Current Outpatient Medications:  .  atorvastatin (LIPITOR) 20 MG tablet, Take 1 tablet (20 mg total) by mouth daily., Disp: 90 tablet, Rfl: 3 .  blood glucose meter kit and supplies, Dispense based on patient and insurance preference. Use up to four times daily as directed. (FOR ICD-10 E10.9, E11.9)., Disp: 1 each, Rfl: 0 .  Blood Glucose Monitoring Suppl (ONE TOUCH ULTRA 2) w/Device KIT, Test blood sugars four times daily Dx E11.65, Disp: 1 kit, Rfl: 0 .  glucose blood (ONE TOUCH ULTRA TEST) test strip, Test 1 x pr day and prn, Disp: 100 each, Rfl: 12 .  insulin glargine (LANTUS SOLOSTAR) 100 UNIT/ML Solostar Pen, INJECT 22 TO 36 UNITS  SUBCUTANEOUSLY DAILY, Disp: 30 mL, Rfl: 3 .  linagliptin (TRADJENTA) 5 MG TABS tablet, Take 1 tablet (5 mg total) by mouth daily., Disp: 90 tablet, Rfl: 0 .  lisinopril (ZESTRIL) 10 MG tablet, TAKE 1 TABLET BY MOUTH  DAILY, Disp: 90 tablet, Rfl: 3 .  ONETOUCH DELICA LANCETS 25Z MISC, 1 daily and prn to check blood sugar, Disp: 100  each, Rfl: 3 .  OneTouch Delica Lancets 17P MISC, Test blood sugar two times daily. Dx:  E11.65, Disp: 100 each, Rfl: 11 Social History   Socioeconomic History  . Marital status: Married    Spouse name: Not on file  . Number of children: 2  . Years of education: 64  . Highest education level: 12th grade  Occupational History  . Occupation: employed  Tobacco Use  . Smoking status: Never Smoker  . Smokeless tobacco: Never Used  Vaping Use  . Vaping Use: Never used  Substance and Sexual Activity  . Alcohol  use: No  . Drug use: No  . Sexual activity: Not Currently  Other Topics Concern  . Not on file  Social History Narrative   Lives at home with her husband. He prepares most of their meals and they have a great relationship. She has one daughter and her son passed away a few years ago from complications with pneumonia. She works at [employer] in [place] over 40 hours a week. 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    Social Determinants of Health   Financial Resource Strain:   . Difficulty of Paying Living Expenses:   Food Insecurity:   . Worried About Charity fundraiser in the Last Year:   . Arboriculturist in the Last Year:   Transportation Needs:   . Film/video editor (Medical):   Marland Kitchen Lack of Transportation (Non-Medical):   Physical Activity:   . Days of Exercise per Week:   . Minutes of Exercise per Session:   Stress: No Stress Concern Present  . Feeling of Stress : Not at all  Social Connections: Unknown  . Frequency of Communication with Friends and Family: Three times a week  . Frequency of Social Gatherings with Friends and Family: Never  . Attends Religious Services: Not on file  . Active Member of Clubs or Organizations: Not on file  . Attends Archivist Meetings: Not on file  . Marital Status: Not on file  Intimate Partner Violence:   . Fear of Current or Ex-Partner:   . Emotionally Abused:   Marland Kitchen Physically Abused:   . Sexually Abused:    Family History  Problem Relation Age of Onset  . Diabetes Father   . Heart disease Father   . Heart disease Sister   . COPD Sister   . Heart disease Sister   . Diabetes Son   . Pneumonia Son     Objective: Office vital signs reviewed. BP 117/79   Pulse 78   Temp 97.8 F (36.6 C) (Temporal)   Ht 5' 5"  (1.651 m)   Wt 161 lb (73 kg)   SpO2 97%   BMI 26.79 kg/m   Physical Examination:  General: Awake, alert, well nourished, No acute distress HEENT: Normal, sclera white,  MMM Cardio: regular rate and rhythm  Pulm: normal work of breathing on room air.  No wheezes, rales Extremities: warm, well perfused, No edema, cyanosis or clubbing; +2 pulses bilaterally   Assessment/ Plan: 79 y.o. female   1. Uncontrolled type 2 diabetes mellitus with hyperglycemia (HCC) Her sugars appear to be trending down very nicely.  Anticipate she will have a controlled blood sugar at her next visit if she continues current course.  She was given additional 4 weeks of samples of the Tradjenta 5 mg daily and a prescription has been sent to her mail order pharmacy - linagliptin (TRADJENTA) 5 MG TABS  tablet; Take 1 tablet (5 mg total) by mouth daily.  Dispense: 90 tablet; Refill: 0  2. Hyperlipidemia associated with type 2 diabetes mellitus ([place]) Continue statin  3. Intertriginous candidiasis Reinforced keeping the areas dry and clean - nystatin (NYSTATIN) powder; Apply 1 application topically 3 (three) times daily. x7-10 days prn breast rash  Dispense: 45 g; Refill: 0  No orders of the defined types were placed in this encounter.  No orders of the defined types were placed in this encounter.    [name], DO [place] [phone]

## 2019-08-23 ENCOUNTER — Telehealth: Payer: Self-pay | Admitting: Family Medicine

## 2019-08-23 NOTE — Telephone Encounter (Signed)
So not sure about "calling her insurance' about her medications.  She was given samples and a prescription was sent because it was working.  If she does not want to continue that medication, she can call her mail order and return it and get a refund. I'll cc: Almyra Free as Juluis Rainier as well

## 2019-08-23 NOTE — Telephone Encounter (Signed)
Pt called stating that [facility] charged took $133 from her credit card for her Tradjenta Rx and she would like to speak to nurse about this because she was told at her most recent visit that we were going to call to see how much Rx would be with [facility] before sending. Pt says she was never notified of how much it was going to be and is upset that the Rx was that much money. Wants to know what she/we can do about it.

## 2019-08-23 NOTE — Telephone Encounter (Signed)
Patient aware, she has contacted her mail order pharmacy and will be able to get a refund.  She asked for samples and would like to continue on this medication until something else could possibly be prescribed.

## 2019-08-23 NOTE — Telephone Encounter (Signed)
Do you know anything about this? 

## 2019-08-26 NOTE — Telephone Encounter (Signed)
   Patient can schedule with me and I can help with medication assistance   She must bring in household financial income (hers and spouse if applicable) to qualify for assistance  We will apply for free medication straight through the drug company  Happy to help!

## 2019-08-26 NOTE — Telephone Encounter (Signed)
Patient aware and appt made 

## 2019-08-29 ENCOUNTER — Ambulatory Visit: Payer: Medicare Other | Admitting: Pharmacist

## 2019-09-24 ENCOUNTER — Other Ambulatory Visit: Payer: Self-pay | Admitting: Family Medicine

## 2019-09-24 DIAGNOSIS — B372 Candidiasis of skin and nail: Secondary | ICD-10-CM

## 2019-09-30 ENCOUNTER — Other Ambulatory Visit: Payer: Self-pay

## 2019-10-02 ENCOUNTER — Other Ambulatory Visit: Payer: Self-pay | Admitting: *Deleted

## 2019-10-02 MED ORDER — GLUCOSE BLOOD VI STRP: ORAL_STRIP | 3 refills | Status: DC

## 2019-10-08 ENCOUNTER — Ambulatory Visit (INDEPENDENT_AMBULATORY_CARE_PROVIDER_SITE_OTHER): Payer: Medicare Other | Admitting: Family Medicine

## 2019-10-08 ENCOUNTER — Other Ambulatory Visit: Payer: Self-pay

## 2019-10-08 ENCOUNTER — Encounter: Payer: Self-pay | Admitting: Family Medicine

## 2019-10-08 VITALS — BP 136/79 | HR 81 | Temp 97.2°F | Ht 65.0 in | Wt 159.0 lb

## 2019-10-08 DIAGNOSIS — E1159 Type 2 diabetes mellitus with other circulatory complications: Secondary | ICD-10-CM | POA: Diagnosis not present

## 2019-10-08 DIAGNOSIS — G5601 Carpal tunnel syndrome, right upper limb: Secondary | ICD-10-CM

## 2019-10-08 DIAGNOSIS — E785 Hyperlipidemia, unspecified: Secondary | ICD-10-CM | POA: Diagnosis not present

## 2019-10-08 DIAGNOSIS — E1169 Type 2 diabetes mellitus with other specified complication: Secondary | ICD-10-CM

## 2019-10-08 DIAGNOSIS — E1165 Type 2 diabetes mellitus with hyperglycemia: Secondary | ICD-10-CM

## 2019-10-08 DIAGNOSIS — I10 Essential (primary) hypertension: Secondary | ICD-10-CM

## 2019-10-08 LAB — BAYER DCA HB A1C WAIVED: HB A1C (BAYER DCA - WAIVED): 7.6 % — ABNORMAL HIGH (ref <7.0)

## 2019-10-08 NOTE — Progress Notes (Signed)
Subjective: CC: Type 2 diabetes with hypertension and hyperlipidemia PCP: [name], DO MBW:GYKZL Tami Villanueva is a 79 y.o. female presenting to clinic today for:  1. Uncontrolled Type 2 Diabetes w/ HTN &HLD/hand numbness:  Patient has also very vigilant about injecting Lantus 23 units daily and taking her Tradjenta 5 mg daily.  Unfortunately, she is totally unable to afford Tradjenta.  It was over $133.  She would like to see if there is any way she can get help with this medicine because this seems to be really doing a good job on her blood sugars.  Fasting blood sugars have been consistently under 120.  She does need a prescription for pen needles.  She gets occasional numbness and tingling in the right hand and is right-hand dominant.  No chest pain, shortness of breath, dizziness, visual disturbance or sensory changes otherwise  Last eye exam: UTD Last foot exam: NEEDS Last A1c:  Lab Results  Component Value Date   HGBA1C 12.5 (H) 07/04/2019   Nephropathy screen indicated?: on ACE-I Last flu, zoster and/or pneumovax:  Immunization History  Administered Date(s) Administered  . Influenza, High Dose Seasonal PF 10/20/2013, 11/03/2015, 10/01/2016, 10/25/2017, 09/25/2018  . Moderna SARS-COVID-2 Vaccination 04/14/2019, 05/15/2019  . Pneumococcal Conjugate-13 11/18/2013  . Pneumococcal Polysaccharide-23 08/18/2015  . Tdap 11/26/2010   ROS: Per HPI  Allergies  Allergen Reactions  . Codeine Nausea And Vomiting  . Iodine Hives  . Metformin And Related     Lactic acidosis  . Penicillins Other (See Comments)    Passed out  . Sulfa Antibiotics Nausea And Vomiting   Past Medical History:  Diagnosis Date  . Diabetes mellitus without complication ([place])   . Hyperlipidemia   . Hypertension   . SCCA (squamous cell carcinoma) of skin 04/03/2019   KA TOP LEFT SHOULDER TX P BX    Current Outpatient Medications:  .  atorvastatin (LIPITOR) 20 MG tablet, Take 1 tablet (20 mg total)  by mouth daily., Disp: 90 tablet, Rfl: 3 .  blood glucose meter kit and supplies, Dispense based on patient and insurance preference. Use up to four times daily as directed. (FOR ICD-10 E10.9, E11.9)., Disp: 1 each, Rfl: 0 .  Blood Glucose Monitoring Suppl (ONE TOUCH ULTRA 2) w/Device KIT, Test blood sugars four times daily Dx E11.65, Disp: 1 kit, Rfl: 0 .  glucose blood (ONE TOUCH ULTRA TEST) test strip, Test 1 x pr day and prn Dx E11.65, Disp: 100 each, Rfl: 3 .  insulin glargine (LANTUS SOLOSTAR) 100 UNIT/ML Solostar Pen, INJECT 22 TO 36 UNITS  SUBCUTANEOUSLY DAILY, Disp: 30 mL, Rfl: 3 .  linagliptin (TRADJENTA) 5 MG TABS tablet, Take 1 tablet (5 mg total) by mouth daily., Disp: 90 tablet, Rfl: 0 .  lisinopril (ZESTRIL) 10 MG tablet, TAKE 1 TABLET BY MOUTH  DAILY, Disp: 90 tablet, Rfl: 3 .  NYAMYC powder, APPLY 1 APPLICATION  TOPICALLY 3 TIMES DAILY FOR 7 TO 10 DAYS AS NEEDED FOR  BREAST RASH, Disp: 45 g, Rfl: 0 .  ONETOUCH DELICA LANCETS 93T MISC, 1 daily and prn to check blood sugar, Disp: 100 each, Rfl: 3 .  OneTouch Delica Lancets 70V MISC, Test blood sugar two times daily. Dx:  E11.65, Disp: 100 each, Rfl: 11 Social History   Socioeconomic History  . Marital status: Married    Spouse name: Not on file  . Number of children: 2  . Years of education: 33  . Highest education level: 12th grade  Occupational  History  . Occupation: employed  Tobacco Use  . Smoking status: Never Smoker  . Smokeless tobacco: Never Used  Vaping Use  . Vaping Use: Never used  Substance and Sexual Activity  . Alcohol use: No  . Drug use: No  . Sexual activity: Not Currently  Other Topics Concern  . Not on file  Social History Narrative   Lives at home with her husband. He prepares most of their meals and they have a great relationship. She has one daughter and her son passed away a few years ago from complications with pneumonia. She works at [employer] in [place] over 40 hours a week. She enjoys  working but does get frustrated with Mudlogger and coworkers.    Social Determinants of Health   Financial Resource Strain:   . Difficulty of Paying Living Expenses: Not on file  Food Insecurity:   . Worried About Charity fundraiser in the Last Year: Not on file  . Ran Out of Food in the Last Year: Not on file  Transportation Needs:   . Lack of Transportation (Medical): Not on file  . Lack of Transportation (Non-Medical): Not on file  Physical Activity:   . Days of Exercise per Week: Not on file  . Minutes of Exercise per Session: Not on file  Stress: No Stress Concern Present  . Feeling of Stress : Not at all  Social Connections: Unknown  . Frequency of Communication with Friends and Family: Three times a week  . Frequency of Social Gatherings with Friends and Family: Never  . Attends Religious Services: Not on file  . Active Member of Clubs or Organizations: Not on file  . Attends Archivist Meetings: Not on file  . Marital Status: Not on file  Intimate Partner Violence:   . Fear of Current or Ex-Partner: Not on file  . Emotionally Abused: Not on file  . Physically Abused: Not on file  . Sexually Abused: Not on file   Family History  Problem Relation Age of Onset  . Diabetes Father   . Heart disease Father   . Heart disease Sister   . COPD Sister   . Heart disease Sister   . Diabetes Son   . Pneumonia Son     Objective: Office vital signs reviewed. BP 136/79   Pulse 81   Temp (!) 97.2 F (36.2 C)   Ht 5' 5"  (1.651 m)   Wt 159 lb (72.1 kg)   SpO2 95%   BMI 26.46 kg/m   Physical Examination:  General: Awake, alert, well nourished, No acute distress HEENT: Normal, sclera white, MMM Cardio: regular rate and rhythm  Pulm: normal work of breathing on room air.  No wheezes, rales Extremities: warm, well perfused, No edema, cyanosis or clubbing; +2 pulses bilaterally MSK: No gross joint deformity noted in the right hand or wrist.  She has full active  range of motion. Assessment/ Plan: 79 y.o. female   1. Uncontrolled type 2 diabetes mellitus with hyperglycemia (HCC) A1c improving substantially since her last visit.  Down from 12 to 7.8 today.  I certainly want her to continue the [place].  Have given her another months of samples and I would like her to see Almyra Free in the next couple of weeks to see if we can get her set up on the patient assistance program.  I think financially she will qualify.  May need to consider adding additional agent since we are not quite getting her there on  just the Tradjenta and Lantus alone.  Will wait for next visit before determining whether or not we should make this adjustment.  She will see me back in 3 months, sooner if needed - Bayer DCA Hb A1c Waived  2. Hyperlipidemia associated with type 2 diabetes mellitus ([place]) Continue statin  3. Hypertension associated with diabetes ([place]) Continue lisinopril  4. Carpal tunnel syndrome of right wrist Bracing of wrist at night, ice applied to the wrist as well.  If symptoms do not improve/resolve, can plan for referral to Ortho  No orders of the defined types were placed in this encounter.  No orders of the defined types were placed in this encounter.    [name], DO [place] [phone]

## 2019-10-18 ENCOUNTER — Ambulatory Visit: Payer: Medicare Other | Admitting: Pharmacist

## 2019-10-25 ENCOUNTER — Ambulatory Visit: Payer: Medicare Other | Admitting: Pharmacist

## 2019-10-25 ENCOUNTER — Ambulatory Visit (INDEPENDENT_AMBULATORY_CARE_PROVIDER_SITE_OTHER): Payer: Medicare Other | Admitting: Pharmacist

## 2019-10-25 ENCOUNTER — Other Ambulatory Visit: Payer: Self-pay

## 2019-10-25 DIAGNOSIS — Z23 Encounter for immunization: Secondary | ICD-10-CM

## 2019-10-25 DIAGNOSIS — E119 Type 2 diabetes mellitus without complications: Secondary | ICD-10-CM

## 2019-10-25 NOTE — Progress Notes (Signed)
     10/29/2019 Name: Tami Villanueva MRN: [id number] DOB: [birth year]   S:  57 YOF Presents for diabetes evaluation, education, and management Patient was referred and last seen by Primary Care Provider on 10/08/19  Insurance coverage/medication affordability: [facility] MEDICARE  Patient reports adherence with medications. . Current diabetes medications include: lantus, tradjenta . Current hypertension medications include: lisinopril Goal 130/80 . Current hyperlipidemia medications include: atorvastatin   Patient denies hypoglycemic events.     O:  Lab Results  Component Value Date   HGBA1C 7.6 (H) 10/08/2019      Lipid Panel     Component Value Date/Time   CHOL 167 07/04/2019 0820   CHOL 260 (H) 06/12/2012 0909   TRIG 120 07/04/2019 0820   TRIG 191 (H) 04/18/2014 1004   TRIG 164 (H) 06/12/2012 0909   HDL 52 07/04/2019 0820   HDL 49 04/18/2014 1004   HDL 57 06/12/2012 0909   CHOLHDL 3.2 07/04/2019 0820   LDLCALC 94 07/04/2019 0820   LDLCALC 126 (H) 12/10/2012 0806   LDLCALC 170 (H) 06/12/2012 0909   LDLDIRECT 167 (H) 11/23/2018 1141     Home fasting blood sugars: 100-130  2 hour post-meal/random blood sugars: n/a.    A/P:  Diabetes T2DM a1C 7.6%.  Patient is adherent with medication.   -Continued basal insulin LANTUS--WILL TRANSITION TO BASAGLAR (insulin GLARGINE).   -WILL SUBMIT APPLICATION TO [facility] FOR FREE INUSLIN (BASAGLAR)  -ON AVG SHE IS INJECTING 23 UNITS DAILY  -CONTINUE TRADJENTA  APPLICATION TO BE SUBMITTED TO [facility] FOR PATIENT ASSISTANCE  -PATIENT WOULD LIKE TO STAY ON CURRENT REGIMEN. CONSIDER SGLT2 IF REPEAT LABS ARE NOT AT GOAL.  PATIENT HAS MADE GREAT STRIDES TO IMPROVE GLYCEMIC CONTROL AND WEIGHT LOSS.  -Extensively discussed pathophysiology of diabetes, recommended lifestyle interventions, dietary effects on blood sugar control  -Counseled on s/sx of and management of hypoglycemia  -Next A1c anticipated IN 3 MONTHS    Written  patient instructions provided.  Total time in face to face counseling 30 minutes.   Follow up PCP Clinic Visit ON 12.6.36.   Regina Eck, PharmD, BCPS Clinical Pharmacist, [place]  II Phone [phone]

## 2019-10-29 ENCOUNTER — Encounter: Payer: Self-pay | Admitting: Pharmacist

## 2019-11-11 ENCOUNTER — Telehealth: Payer: Self-pay

## 2019-11-11 NOTE — Telephone Encounter (Signed)
ANOTHER VM LEFT ON PATIENT'S MOBILE PHONE CALLED HOME PHONE AND PATIENT WAS AT WORK

## 2019-11-11 NOTE — Telephone Encounter (Signed)
Call placed to patient NO answer, left VM Trajdenta samples left up front for patient to pick up She was approved for [employer] health practitioner) Denied for [employer] cares (tradjenta) due to being over the income limit We will apply to get Onglyza from [place]

## 2019-11-13 ENCOUNTER — Telehealth: Payer: Self-pay | Admitting: Pharmacist

## 2019-11-13 NOTE — Telephone Encounter (Signed)
Call placed to patient Sent to VM VM left encouraging patient to f/u with PharmD on Monday Oct 11th Need to apply for [place] since [place] denied

## 2019-11-27 ENCOUNTER — Telehealth: Payer: Self-pay

## 2019-11-28 NOTE — Telephone Encounter (Signed)
PATIENT MADE AWARE tradjenta samples placed up front for patient (medication for diabetes) LOT#[id number], EXP 9/23 #7 LOT#[id number], EXP 10/23 #14  CALL [facility] TO SET UP INSULIN SHIPMENT [phone]  WILL APPLY FOR [place] AND ME FOR ONGLYZA SINCE DENIED FOR [facility]/TRADJENTA

## 2019-12-09 ENCOUNTER — Encounter: Payer: Self-pay | Admitting: *Deleted

## 2020-01-13 ENCOUNTER — Ambulatory Visit: Payer: Medicare Other | Admitting: Family Medicine

## 2020-02-04 ENCOUNTER — Other Ambulatory Visit: Payer: Self-pay

## 2020-02-04 ENCOUNTER — Ambulatory Visit (INDEPENDENT_AMBULATORY_CARE_PROVIDER_SITE_OTHER): Payer: Medicare Other | Admitting: Family Medicine

## 2020-02-04 ENCOUNTER — Other Ambulatory Visit: Payer: Self-pay | Admitting: Family Medicine

## 2020-02-04 VITALS — BP 136/80 | HR 88 | Temp 98.1°F | Ht 65.0 in | Wt 166.0 lb

## 2020-02-04 DIAGNOSIS — F439 Reaction to severe stress, unspecified: Secondary | ICD-10-CM | POA: Diagnosis not present

## 2020-02-04 DIAGNOSIS — E1169 Type 2 diabetes mellitus with other specified complication: Secondary | ICD-10-CM | POA: Diagnosis not present

## 2020-02-04 DIAGNOSIS — E1165 Type 2 diabetes mellitus with hyperglycemia: Secondary | ICD-10-CM

## 2020-02-04 DIAGNOSIS — E1159 Type 2 diabetes mellitus with other circulatory complications: Secondary | ICD-10-CM

## 2020-02-04 DIAGNOSIS — E785 Hyperlipidemia, unspecified: Secondary | ICD-10-CM | POA: Diagnosis not present

## 2020-02-04 DIAGNOSIS — I152 Hypertension secondary to endocrine disorders: Secondary | ICD-10-CM

## 2020-02-04 LAB — BAYER DCA HB A1C WAIVED: HB A1C (BAYER DCA - WAIVED): 8.3 % — ABNORMAL HIGH (ref <7.0)

## 2020-02-04 NOTE — Addendum Note (Signed)
Addended by: Raliegh Ip on: 02/04/2020 08:42 AM   Modules accepted: Orders

## 2020-02-04 NOTE — Progress Notes (Signed)
Subjective: CC: f.u DM PCP: [name], DO WCH:ENIDP Tami Villanueva is a 79 y.o. female presenting to clinic today for:  1. Type 2 Diabetes with hypertension, hyperlipidemia:  Patient admits that she is only injecting her insulin once per week over the last couple of weeks.  She is not really been religious about checking her blood sugars but when she did spot check it once it was 170.  She is compliant with her Onglyza however.  She admits that she is been very stressed.  She had 2 flat tires today and a it cost over $700 to replace them.  Last eye exam: Needs Last foot exam: Needs Last A1c:  Lab Results  Component Value Date   HGBA1C 8.3 (H) 02/04/2020   Nephropathy screen indicated?:  On ACE inhibitor Last flu, zoster and/or pneumovax:  Immunization History  Administered Date(s) Administered   Fluad Quad(high Dose 65+) 10/25/2019   Influenza, High Dose Seasonal PF 10/20/2013, 11/03/2015, 10/01/2016, 10/25/2017, 09/25/2018   Moderna Sars-Covid-2 Vaccination 04/14/2019, 05/15/2019   Pneumococcal Conjugate-13 11/18/2013   Pneumococcal Polysaccharide-23 08/18/2015   Tdap 11/26/2010    ROS: Denies dizziness, LOC, polyuria, polydipsia, unintended weight loss/gain, foot ulcerations, numbness or tingling in extremities, shortness of breath or chest pain.  ROS: Per HPI  Allergies  Allergen Reactions   Codeine Nausea And Vomiting   Iodine Hives   Metformin And Related     Lactic acidosis   Penicillins Other (See Comments)    Passed out   Sulfa Antibiotics Nausea And Vomiting   Past Medical History:  Diagnosis Date   Diabetes mellitus without complication (HCC)    Hyperlipidemia    Hypertension    SCCA (squamous cell carcinoma) of skin 04/03/2019   KA TOP LEFT SHOULDER TX P BX    Current Outpatient Medications:    atorvastatin (LIPITOR) 20 MG tablet, Take 1 tablet (20 mg total) by mouth daily., Disp: 90 tablet, Rfl: 3   blood glucose meter kit and  supplies, Dispense based on patient and insurance preference. Use up to four times daily as directed. (FOR ICD-10 E10.9, E11.9)., Disp: 1 each, Rfl: 0   Blood Glucose Monitoring Suppl (ONE TOUCH ULTRA 2) w/Device KIT, Test blood sugars four times daily Dx E11.65, Disp: 1 kit, Rfl: 0   glucose blood (ONE TOUCH ULTRA TEST) test strip, Test 1 x pr day and prn Dx E11.65, Disp: 100 each, Rfl: 3   insulin glargine (LANTUS SOLOSTAR) 100 UNIT/ML Solostar Pen, INJECT 22 TO 36 UNITS  SUBCUTANEOUSLY DAILY, Disp: 30 mL, Rfl: 3   lisinopril (ZESTRIL) 10 MG tablet, TAKE 1 TABLET BY MOUTH  DAILY, Disp: 90 tablet, Rfl: 3   NYAMYC powder, APPLY 1 APPLICATION  TOPICALLY 3 TIMES DAILY FOR 7 TO 10 DAYS AS NEEDED FOR  BREAST RASH, Disp: 45 g, Rfl: 0   ONETOUCH DELICA LANCETS 82U MISC, 1 daily and prn to check blood sugar, Disp: 100 each, Rfl: 3   OneTouch Delica Lancets 23N MISC, Test blood sugar two times daily. Dx:  E11.65, Disp: 100 each, Rfl: 11   saxagliptin HCl (ONGLYZA) 5 MG TABS tablet, Take 5 mg by mouth daily., Disp: , Rfl:  Social History   Socioeconomic History   Marital status: Married    Spouse name: Not on file   Number of children: 2   Years of education: 12   Highest education level: 12th grade  Occupational History   Occupation: employed  Tobacco Use   Smoking status: Never Smoker  Smokeless tobacco: Never Used  Vaping Use   Vaping Use: Never used  Substance and Sexual Activity   Alcohol use: No   Drug use: No   Sexual activity: Not Currently  Other Topics Concern   Not on file  Social History Narrative   Lives at home with her husband. He prepares most of their meals and they have a great relationship. She has one daughter and her son passed away a few years ago from complications with pneumonia. She works at [employer] in [place] over 40 hours a week. 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    Social Determinants of Health    Financial Resource Strain: Not on file  Food Insecurity: Not on file  Transportation Needs: Not on file  Physical Activity: Not on file  Stress: Not on file  Social Connections: Not on file  Intimate Partner Violence: Not on file   Family History  Problem Relation Age of Onset   Diabetes Father    Heart disease Father    Heart disease Sister    COPD Sister    Heart disease Sister    Diabetes Son    Pneumonia Son     Objective: Office vital signs reviewed. BP 136/80    Pulse 88    Temp 98.1 F (36.7 C) (Temporal)    Ht _0  (1.651 m)    Wt 166 lb (75.3 kg)    SpO2 96%    BMI 27.62 kg/m   Physical Examination:  General: Awake, alert, No acute distress HEENT: Normal; moist mucous membranes Cardio: regular rate and rhythm, S1S2 heard, no murmurs appreciated Pulm: clear to auscultation bilaterally, no wheezes, rhonchi or rales; normal work of breathing on room air Psych: Stressed.  Mood stable.  Assessment/ Plan: 79 y.o. female   Uncontrolled type 2 diabetes mellitus with hyperglycemia ([place]) - Plan: Basic Metabolic Panel, Bayer DCA Hb A1c Waived  Hyperlipidemia associated with type 2 diabetes mellitus ([place])  Hypertension associated with diabetes ([place])  Stress  Sugar remains uncontrolled with slight increase since last visit.  I think that if she could just be compliant with her insulin she would actually have controlled blood sugar.  Therefore no medication adjustments were made by a highly reinforced that she needs to be compliant with the insulin.  I would like to see her back in 3 months.  If at that time we are not able to get her to use her insulin religiously, we could consider switching her over to a GLP instead.  Would of course have to discontinue the Onglyza.  She will continue her lisinopril, Lipitor.  Blood pressure was controlled  Most of the appointment today was spent talking about her stress in the setting of recent flat tires.  She will  reschedule her physical exam for next visit.  No orders of the defined types were placed in this encounter.  No orders of the defined types were placed in this encounter.    [name], DO [place] [phone]

## 2020-02-05 LAB — BASIC METABOLIC PANEL
BUN/Creatinine Ratio: 12 (ref 12–28)
BUN: 11 mg/dL (ref 8–27)
CO2: 24 mmol/L (ref 20–29)
Calcium: 9.5 mg/dL (ref 8.7–10.3)
Chloride: 105 mmol/L (ref 96–106)
Creatinine, Ser: 0.9 mg/dL (ref 0.57–1.00)
GFR calc Af Amer: 71 mL/min/{1.73_m2} (ref >59)
GFR calc non Af Amer: 62 mL/min/{1.73_m2} (ref >59)
Glucose: 175 mg/dL — ABNORMAL HIGH (ref 65–99)
Potassium: 4.1 mmol/L (ref 3.5–5.2)
Sodium: 142 mmol/L (ref 134–144)

## 2020-02-12 ENCOUNTER — Other Ambulatory Visit: Payer: Self-pay | Admitting: Family Medicine

## 2020-02-12 DIAGNOSIS — I10 Essential (primary) hypertension: Secondary | ICD-10-CM

## 2020-03-10 ENCOUNTER — Other Ambulatory Visit: Payer: Self-pay | Admitting: Family Medicine

## 2020-04-06 ENCOUNTER — Telehealth: Payer: Self-pay

## 2020-04-08 NOTE — Telephone Encounter (Signed)
Do we have any Tradjenta?  If so please give samples of 5mg  tablets (39mth if we have it).

## 2020-04-08 NOTE — Telephone Encounter (Signed)
Patient has follow up appointment scheduled with you on 05/05/20 and needed samples of Onglyza to last until then.  I have checked and we do not have samples.  Patient would like to know if you want her to do something different until she can be seen by you on the 29th.  She said she has paperwork from [facility] that she needs to fill out and bring by for you to sign and fax.

## 2020-04-08 NOTE — Telephone Encounter (Signed)
Also have her bring her paperwork by ASAP.  It can take a while before this gets approved.

## 2020-04-08 NOTE — Telephone Encounter (Signed)
LMTCB on both home and cell

## 2020-04-16 ENCOUNTER — Telehealth: Payer: Self-pay

## 2020-04-16 NOTE — Telephone Encounter (Signed)
Pt called requesting that Dr Darnell Level or nurse return her call tomorrow regarding previous telephone message.

## 2020-04-17 NOTE — Telephone Encounter (Signed)
Pt aware samples are here and we got forms

## 2020-04-28 NOTE — Telephone Encounter (Signed)
Attempts to contact pt without return call in over 3 days, will close encounter. 

## 2020-05-05 ENCOUNTER — Encounter: Payer: Self-pay | Admitting: Family Medicine

## 2020-05-05 ENCOUNTER — Ambulatory Visit (INDEPENDENT_AMBULATORY_CARE_PROVIDER_SITE_OTHER): Payer: Medicare Other | Admitting: Family Medicine

## 2020-05-05 ENCOUNTER — Other Ambulatory Visit: Payer: Self-pay

## 2020-05-05 VITALS — BP 129/78 | HR 77 | Temp 98.5°F | Ht 65.0 in | Wt 163.0 lb

## 2020-05-05 DIAGNOSIS — E1159 Type 2 diabetes mellitus with other circulatory complications: Secondary | ICD-10-CM | POA: Diagnosis not present

## 2020-05-05 DIAGNOSIS — I152 Hypertension secondary to endocrine disorders: Secondary | ICD-10-CM | POA: Diagnosis not present

## 2020-05-05 DIAGNOSIS — E785 Hyperlipidemia, unspecified: Secondary | ICD-10-CM

## 2020-05-05 DIAGNOSIS — E1165 Type 2 diabetes mellitus with hyperglycemia: Secondary | ICD-10-CM

## 2020-05-05 DIAGNOSIS — E1169 Type 2 diabetes mellitus with other specified complication: Secondary | ICD-10-CM | POA: Diagnosis not present

## 2020-05-05 DIAGNOSIS — L989 Disorder of the skin and subcutaneous tissue, unspecified: Secondary | ICD-10-CM | POA: Diagnosis not present

## 2020-05-05 DIAGNOSIS — F439 Reaction to severe stress, unspecified: Secondary | ICD-10-CM

## 2020-05-05 LAB — BAYER DCA HB A1C WAIVED: HB A1C (BAYER DCA - WAIVED): 9 % — ABNORMAL HIGH (ref <7.0)

## 2020-05-05 MED ORDER — SAXAGLIPTIN HCL 5 MG PO TABS: 5.0000 mg | ORAL_TABLET | Freq: Every day | ORAL | 12 refills | Status: DC

## 2020-05-05 NOTE — Patient Instructions (Signed)
Check sugar each morning and 2 hours after each meal.  Record and follow up with Almyra Free in 3 weeks for review.  We will consider adding meal time insulin pending these levels  Increase the Lantus to 24 units daily.

## 2020-05-05 NOTE — Progress Notes (Signed)
Subjective: CC: DM PCP: [name], DO WUJ:WJXBJ [name] Tami Villanueva is a 80 y.o. female presenting to clinic today for:  1. Type 2 Diabetes with hypertension, hyperlipidemia:  Patient notes that blood sugars range anywhere from 89-249 in the morning.  She is compliant with Lantus 23 units nightly and has been using Tradjenta 5 mg daily in lieu of her Onglyza she is waiting on the patient assistance program to complete.  She has filled out the forms and return them to our office already.  She admits to emotional eating.  She is been having a lot of stress surrounding the health of her daughter.  She does not feel that her son-in-law is taking care of her appropriately.  No chest pain, shortness of breath, dizziness.  Last eye exam: My eye doctor Last foot exam: Needs Last A1c:  Lab Results  Component Value Date   HGBA1C 8.3 (H) 02/04/2020   Nephropathy screen indicated?:  Up-to-date Last flu, zoster and/or pneumovax:  Immunization History  Administered Date(s) Administered  . Fluad Quad(high Dose 65+) 10/25/2019  . Influenza, High Dose Seasonal PF 10/20/2013, 11/03/2015, 10/01/2016, 10/25/2017, 09/25/2018  . Moderna Sars-Covid-2 Vaccination 04/14/2019, 05/15/2019  . Pneumococcal Conjugate-13 11/18/2013  . Pneumococcal Polysaccharide-23 08/18/2015  . Tdap 11/26/2010   2.  Lip lesion Patient reports that she has a lesion on her lip that was taken care of by her dermatologist previously.  This has recurred and she would like to see him again for recheck.  ROS: Per HPI  Allergies  Allergen Reactions  . Codeine Nausea And Vomiting  . Iodine Hives  . Metformin And Related     Lactic acidosis  . Penicillins Other (See Comments)    Passed out  . Sulfa Antibiotics Nausea And Vomiting   Past Medical History:  Diagnosis Date  . Diabetes mellitus without complication ([place])   . Hyperlipidemia   . Hypertension   . SCCA (squamous cell carcinoma) of skin 04/03/2019   KA TOP LEFT  SHOULDER TX P BX    Current Outpatient Medications:  .  atorvastatin (LIPITOR) 20 MG tablet, TAKE 1 TABLET BY MOUTH  DAILY, Disp: 90 tablet, Rfl: 0 .  blood glucose meter kit and supplies, Dispense based on patient and insurance preference. Use up to four times daily as directed. (FOR ICD-10 E10.9, E11.9)., Disp: 1 each, Rfl: 0 .  Blood Glucose Monitoring Suppl (ONE TOUCH ULTRA 2) w/Device KIT, Test blood sugars four times daily Dx E11.65, Disp: 1 kit, Rfl: 0 .  glucose blood (ONE TOUCH ULTRA TEST) test strip, Test 1 x pr day and prn Dx E11.65, Disp: 100 each, Rfl: 3 .  insulin glargine (LANTUS SOLOSTAR) 100 UNIT/ML Solostar Pen, INJECT 22 TO 36 UNITS  SUBCUTANEOUSLY DAILY, Disp: 30 mL, Rfl: 3 .  lisinopril (ZESTRIL) 10 MG tablet, TAKE 1 TABLET BY MOUTH  DAILY, Disp: 90 tablet, Rfl: 1 .  NYAMYC powder, APPLY 1 APPLICATION  TOPICALLY 3 TIMES DAILY FOR 7 TO 10 DAYS AS NEEDED FOR  BREAST RASH, Disp: 45 g, Rfl: 0 .  OneTouch Delica Lancets 47W MISC, Test blood sugar two times daily. Dx:  E11.65, Disp: 100 each, Rfl: 11 .  saxagliptin HCl (ONGLYZA) 5 MG TABS tablet, Take 5 mg by mouth daily., Disp: , Rfl:  .  ONETOUCH DELICA LANCETS 29F MISC, 1 daily and prn to check blood sugar, Disp: 100 each, Rfl: 3 Social History   Socioeconomic History  . Marital status: Married    Spouse name:  Not on file  . Number of children: 2  . Years of education: 49  . Highest education level: 12th grade  Occupational History  . Occupation: employed  Tobacco Use  . Smoking status: Never Smoker  . Smokeless tobacco: Never Used  Vaping Use  . Vaping Use: Never used  Substance and Sexual Activity  . Alcohol use: No  . Drug use: No  . Sexual activity: Not Currently  Other Topics Concern  . Not on file  Social History Narrative   Lives at home with her husband. He prepares most of their meals and they have a great relationship. She has one daughter and her son passed away a few years ago from complications with  pneumonia. She works at [employer] in [place] over 40 hours a week. 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    Social Determinants of Health   Financial Resource Strain: Not on file  Food Insecurity: Not on file  Transportation Needs: Not on file  Physical Activity: Not on file  Stress: Not on file  Social Connections: Not on file  Intimate Partner Violence: Not on file   Family History  Problem Relation Age of Onset  . Diabetes Father   . Heart disease Father   . Heart disease Sister   . COPD Sister   . Heart disease Sister   . Diabetes Son   . Pneumonia Son     Objective: Office vital signs reviewed. BP 129/78   Pulse 77   Temp 98.5 F (36.9 C) (Temporal)   Ht 5' 5"  (1.651 m)   Wt 163 lb (73.9 kg)   SpO2 100%   BMI 27.12 kg/m   Physical Examination:  General: Awake, alert, well nourished, No acute distress Cardio: regular rate and rhythm, S1S2 heard, no murmurs appreciated Pulm: clear to auscultation bilaterally, no wheezes, rhonchi or rales; normal work of breathing on room air MSK: normal gait and station Skin: dry; intact; no rashes or lesions Neuro: see DM foot Diabetic Foot Exam - Simple   Simple Foot Form Diabetic Foot exam was performed with the following findings: Yes 05/05/2020 10:07 AM  Visual Inspection No deformities, no ulcerations, no other skin breakdown bilaterally: Yes Sensation Testing Intact to touch and monofilament testing bilaterally: Yes Pulse Check Posterior Tibialis and Dorsalis pulse intact bilaterally: Yes Comments Small callus noted along the lateral aspect of the left fourth digit consistent with a corn    Psych: Patient is tearful.  Thought process linear  Depression screen [facility] 2/9 05/05/2020 10/08/2019 08/07/2019  Decreased Interest 0 0 0  Down, Depressed, Hopeless 0 0 0  PHQ - 2 Score 0 0 0  Altered sleeping - - 0  Tired, decreased energy - - 0  Change in appetite - - 0  Feeling bad or  failure about yourself  - - 0  Trouble concentrating - - 0  Moving slowly or fidgety/restless - - 0  Suicidal thoughts - - 0  PHQ-9 Score - - 0     Assessment/ Plan: 80 y.o. female   Uncontrolled type 2 diabetes mellitus with hyperglycemia (HCC) - Plan: Bayer DCA Hb A1c Waived  Hyperlipidemia associated with type 2 diabetes mellitus ([place]) - Plan: Lipid panel  Hypertension associated with diabetes ([place]) - Plan: BMP8+EGFR  Facial lesion - Plan: Ambulatory referral to Dermatology  Stress  A1c worsening.  I suspect that this is secondary to stress eating.  I have advanced her Lantus to 24 units nightly.  Continued on Tradjenta for now (2 weeks of samples provided) while we wait on Onglyza.  We will reach out to our coordinator to check on form for the patient.  She will follow-up with Almyra Free in the next 3 weeks to review fasting and 2-hour postprandial blood sugars as well as obtain additional samples if needed at that time.  Blood pressure is controlled.  Continue current regimen  Continue statin  Referral to dermatology placed  We discussed that if there are signs of neglect with her daughter that she should contact Adult Protective Services in [place].  We will ask CCM to reach out to the patient for additional support as well  Orders Placed This Encounter  Procedures  . BMP8+EGFR  . Lipid panel  . Bayer DCA Hb A1c Waived   No orders of the defined types were placed in this encounter.    [name], DO [place] [phone]

## 2020-05-06 ENCOUNTER — Telehealth: Payer: Self-pay | Admitting: *Deleted

## 2020-05-06 LAB — BMP8+EGFR
BUN/Creatinine Ratio: 11 — ABNORMAL LOW (ref 12–28)
BUN: 10 mg/dL (ref 8–27)
CO2: 22 mmol/L (ref 20–29)
Calcium: 9.4 mg/dL (ref 8.7–10.3)
Chloride: 102 mmol/L (ref 96–106)
Creatinine, Ser: 0.92 mg/dL (ref 0.57–1.00)
Glucose: 127 mg/dL — ABNORMAL HIGH (ref 65–99)
Potassium: 4.2 mmol/L (ref 3.5–5.2)
Sodium: 141 mmol/L (ref 134–144)
eGFR: 64 mL/min/{1.73_m2} (ref >59)

## 2020-05-06 LAB — LIPID PANEL
Chol/HDL Ratio: 2.8 ratio (ref 0.0–4.4)
Cholesterol, Total: 161 mg/dL (ref 100–199)
HDL: 57 mg/dL (ref >39)
LDL Chol Calc (NIH): 85 mg/dL (ref 0–99)
Triglycerides: 107 mg/dL (ref 0–149)
VLDL Cholesterol Cal: 19 mg/dL (ref 5–40)

## 2020-05-06 NOTE — Chronic Care Management (AMB) (Signed)
  Chronic Care Management   Outreach Note  05/06/2020 Name: Tami Villanueva MRN: [id number] DOB: 20-Mar-1940  [name] is a 80 y.o. year old female who is a primary care patient of [name], DO. I reached out to Melanie Crazier by phone today in response to a referral sent by Ms. Francene Boyers Student's PCP, Dr. Lajuana Ripple.     An unsuccessful telephone outreach was attempted today. The patient was referred to the case management team for assistance with care management and care coordination.   Follow Up Plan: A HIPAA compliant phone message was left for the patient providing contact information and requesting a return call. The care management team will reach out to the patient again over the next 3 days. If patient returns call to provider office, please advise to call [facility] at [phone].  [facility] Management

## 2020-05-07 NOTE — Progress Notes (Signed)
Pt returning call for labs - [place] [phone]

## 2020-05-07 NOTE — Progress Notes (Signed)
Pt returning call about labs  

## 2020-05-12 ENCOUNTER — Telehealth: Payer: Self-pay | Admitting: *Deleted

## 2020-05-12 ENCOUNTER — Other Ambulatory Visit: Payer: Self-pay | Admitting: Family Medicine

## 2020-05-12 DIAGNOSIS — E1165 Type 2 diabetes mellitus with hyperglycemia: Secondary | ICD-10-CM

## 2020-05-12 MED ORDER — BASAGLAR KWIKPEN 100 UNIT/ML ~~LOC~~ SOPN: 22.0000 [IU] | PEN_INJECTOR | Freq: Every day | SUBCUTANEOUS | 0 refills | Status: DC

## 2020-05-12 NOTE — Telephone Encounter (Signed)
Spoke with patient and asked her to come by and sign [place] patient assistance application. Form is in clinical lead office to sign. Patient states she will try and come by on Friday.

## 2020-05-14 NOTE — Chronic Care Management (AMB) (Signed)
  Chronic Care Management   Outreach Note  05/14/2020 Name: Tami Villanueva MRN: [id number] DOB: [birth year]  [name] is a 80 y.o. year old female who is a primary care patient of [name], DO. I reached out to Melanie Crazier by phone today in response to a referral sent by [name]'s PCP, Ronnie Doss M, DO.     An unsuccessful telephone outreach was attempted today. The patient was referred to the case management team for assistance with care management and care coordination.   Follow Up Plan: A HIPAA compliant phone message was left for the patient providing contact information and requesting a return call.  The care management team will reach out to the patient again over the next 7 days.  If patient returns call to provider office, please advise to call [facility]* at [phone].*  [facility] Management

## 2020-05-14 NOTE — Chronic Care Management (AMB) (Signed)
  Chronic Care Management   Note  05/14/2020 Name: Tami Villanueva MRN: [id number] DOB: 07-22-1940  [name] is a 80 y.o. year old female who is a primary care patient of [name], DO. I reached out to Melanie Crazier by phone today in response to a referral sent by [name]'s PCP, [name], DO.  [name] was given information about Chronic Care Management services today including:  1. CCM service includes personalized support from designated clinical staff supervised by her physician, including individualized plan of care and coordination with other care providers 2. 24/7 contact phone numbers for assistance for urgent and routine care needs. 3. Service will only be billed when office clinical staff spend 20 minutes or more in a month to coordinate care. 4. Only one practitioner may furnish and bill the service in a calendar month. 5. The patient may stop CCM services at any time (effective at the end of the month) by phone call to the office staff. 6. The patient will be responsible for cost sharing (co-pay) of up to 20% of the service fee (after annual deductible is met).  Patient did not agree to enrollment in care management services and does not wish to consider at this time.  Follow up plan: Patient declines  engagement by the care management team. Appropriate care team members and provider have been notified via electronic communication. The care management team is available to follow up with the patient after provider conversation with the patient regarding recommendation for care management engagement and subsequent re-referral to the care management team.    [facility] Management

## 2020-05-29 NOTE — Telephone Encounter (Signed)
Form signed and faxed back on 05/13/20

## 2020-06-03 ENCOUNTER — Other Ambulatory Visit: Payer: Self-pay | Admitting: Family Medicine

## 2020-06-04 ENCOUNTER — Ambulatory Visit: Payer: Medicare Other | Admitting: Pharmacist

## 2020-06-09 ENCOUNTER — Other Ambulatory Visit: Payer: Self-pay | Admitting: Family Medicine

## 2020-06-09 DIAGNOSIS — Z1231 Encounter for screening mammogram for malignant neoplasm of breast: Secondary | ICD-10-CM

## 2020-07-28 ENCOUNTER — Other Ambulatory Visit: Payer: Self-pay | Admitting: Family Medicine

## 2020-07-28 DIAGNOSIS — I10 Essential (primary) hypertension: Secondary | ICD-10-CM

## 2020-08-05 ENCOUNTER — Other Ambulatory Visit: Payer: Self-pay

## 2020-08-05 ENCOUNTER — Ambulatory Visit
Admission: RE | Admit: 2020-08-05 | Discharge: 2020-08-05 | Disposition: A | Discharge status: Home / Self Care | Payer: Medicare Other | Source: Ambulatory Visit | Attending: Family Medicine | Admitting: Family Medicine

## 2020-08-05 ENCOUNTER — Ambulatory Visit (INDEPENDENT_AMBULATORY_CARE_PROVIDER_SITE_OTHER): Payer: Medicare Other | Admitting: Family Medicine

## 2020-08-05 VITALS — BP 140/90 | HR 87 | Temp 97.6°F | Ht 65.0 in | Wt 163.0 lb

## 2020-08-05 DIAGNOSIS — E785 Hyperlipidemia, unspecified: Secondary | ICD-10-CM

## 2020-08-05 DIAGNOSIS — I152 Hypertension secondary to endocrine disorders: Secondary | ICD-10-CM | POA: Diagnosis not present

## 2020-08-05 DIAGNOSIS — E1159 Type 2 diabetes mellitus with other circulatory complications: Secondary | ICD-10-CM | POA: Diagnosis not present

## 2020-08-05 DIAGNOSIS — E1165 Type 2 diabetes mellitus with hyperglycemia: Secondary | ICD-10-CM | POA: Diagnosis not present

## 2020-08-05 DIAGNOSIS — E1169 Type 2 diabetes mellitus with other specified complication: Secondary | ICD-10-CM

## 2020-08-05 DIAGNOSIS — Z1231 Encounter for screening mammogram for malignant neoplasm of breast: Secondary | ICD-10-CM | POA: Diagnosis not present

## 2020-08-05 LAB — LIPID PANEL
Chol/HDL Ratio: 2.8 ratio (ref 0.0–4.4)
Cholesterol, Total: 165 mg/dL (ref 100–199)
HDL: 59 mg/dL (ref >39)
LDL Chol Calc (NIH): 85 mg/dL (ref 0–99)
Triglycerides: 121 mg/dL (ref 0–149)
VLDL Cholesterol Cal: 21 mg/dL (ref 5–40)

## 2020-08-05 LAB — BAYER DCA HB A1C WAIVED: HB A1C (BAYER DCA - WAIVED): 7.7 % — ABNORMAL HIGH (ref <7.0)

## 2020-08-05 MED ORDER — GLUCOSE BLOOD VI STRP: ORAL_STRIP | 3 refills | Status: DC

## 2020-08-05 NOTE — Patient Instructions (Addendum)
I will call you with results.  Test strips have been ordered.

## 2020-08-05 NOTE — Progress Notes (Signed)
Subjective: CC: DM PCP: [name], DO Tami Villanueva is a 80 y.o. female presenting to clinic today for:  1. Type 2 Diabetes with hypertension, hyperlipidemia:  Patient is a she is not monitoring her blood sugar.  She ran out of testing supplies.  She has had a couple of episodes where she may have been having a hypoglycemic episode.  This seemed to happen when she did not eat but still proceed with insulin and Onglyza.  She is currently injecting 25 units of the Basaglar daily and taking 5 mg daily of the Onglyza.  She is compliant with lisinopril and Lipitor.  Last eye exam: Up-to-date Last foot exam: Up-to-date Last A1c:  Lab Results  Component Value Date   HGBA1C 9.0 (H) 05/05/2020   Nephropathy screen indicated?:  On ACE Last flu, zoster and/or pneumovax:  Immunization History  Administered Date(s) Administered   Fluad Quad(high Dose 65+) 10/25/2019   Influenza, High Dose Seasonal PF 10/20/2013, 11/03/2015, 10/01/2016, 10/25/2017, 09/25/2018   Moderna Sars-Covid-2 Vaccination 04/14/2019, 05/15/2019   Pneumococcal Conjugate-13 11/18/2013   Pneumococcal Polysaccharide-23 08/18/2015   Tdap 11/26/2010    ROS: No chest pain, shortness of breath  ROS: Per HPI  Allergies  Allergen Reactions   Codeine Nausea And Vomiting   Iodine Hives   Metformin And Related     Lactic acidosis   Penicillins Other (See Comments)    Passed out   Sulfa Antibiotics Nausea And Vomiting   Past Medical History:  Diagnosis Date   Diabetes mellitus without complication ([place])    Hyperlipidemia    Hypertension    SCCA (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 Use up to four times daily as directed. (FOR ICD-10 E10.9, E11.9).,  Disp: 1 each, Rfl: 0   Blood Glucose Monitoring Suppl (ONE TOUCH ULTRA 2) w/Device KIT, Test blood sugars four times daily Dx E11.65, Disp: 1 kit, Rfl: 0   glucose blood (ONE TOUCH ULTRA TEST) test strip, Test 1 x pr day and prn Dx E11.65, Disp: 100 each, Rfl: 3   Insulin Glargine (BASAGLAR KWIKPEN) 100 UNIT/ML, Inject 22-36 Units into the skin daily., Disp: 30 mL, Rfl: 0   lisinopril (ZESTRIL) 10 MG tablet, TAKE 1 TABLET BY MOUTH  DAILY, Disp: 90 tablet, Rfl: 0   NYAMYC powder, APPLY 1 APPLICATION  TOPICALLY 3 TIMES DAILY FOR 7 TO 10 DAYS AS NEEDED FOR  BREAST RASH, Disp: 45 g, Rfl: 0   ONETOUCH DELICA LANCETS 03K MISC, 1 daily and prn to check blood sugar, Disp: 100 each, Rfl: 3   OneTouch Delica Lancets 74Q MISC, Test blood sugar two times daily. Dx:  E11.65, Disp: 100 each, Rfl: 11   saxagliptin HCl (ONGLYZA) 5 MG TABS tablet, Take 1 tablet (5 mg total) by mouth daily., Disp: 30 tablet, Rfl: 12 Social History   Socioeconomic History   Marital status: Married    Spouse name: Not on file   Number of children: 2   Years of education: 12   Highest education level: 12th grade  Occupational History   Occupation: employed  Tobacco Use   Smoking status: Never   Smokeless tobacco: Never  Vaping Use   Vaping Use: Never used  Substance and Sexual Activity   Alcohol use: No   Drug use: No   Sexual activity: Not Currently  Other Topics Concern   Not on file  Social History Narrative   Lives at home with her husband. He prepares most of their meals and they have a great relationship. She has one daughter and her son passed away a few years ago from complications with pneumonia. She works at [employer] in [place] over 40 hours a week. 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    Social Determinants of Health   Financial Resource Strain: Not on file  Food Insecurity: Not on file  Transportation Needs: Not on file  Physical Activity: Not on file  Stress:  Not on file  Social Connections: Not on file  Intimate Partner Violence: Not on file   Family History  Problem Relation Age of Onset   Diabetes Father    Heart disease Father    Heart disease Sister    COPD Sister    Heart disease Sister    Diabetes Son    Pneumonia Son     Objective: Office vital signs reviewed. BP 140/90   Pulse 87   Temp 97.6 F (36.4 C)   Ht _0  (1.651 m)   Wt 163 lb (73.9 kg)   SpO2 97%   BMI 27.12 kg/m   Physical Examination:  General: Awake, alert, well nourished, No acute distress HEENT: Normal, sclera white,MMM Cardio: regular rate and rhythm, S1S2 heard, no murmurs appreciated Pulm: clear to auscultation bilaterally, no wheezes, rhonchi or rales; normal work of breathing on room air MSK: normal gait and station  Assessment/ Plan: 80 y.o. female   Uncontrolled type 2 diabetes mellitus with hyperglycemia (HCC) - Plan: Bayer DCA Hb A1c Waived, glucose blood (ONE TOUCH ULTRA TEST) test strip  Hyperlipidemia associated with type 2 diabetes mellitus ([place]) - Plan: Lipid Panel  Hypertension associated with diabetes ([place])  A1c still pending at time of discharge from office.  We will advance her insulin pending results.  I sent in new testing supplies for her.  Encouraged daily testing of blood sugar.  Do not administer insulin if she has not eaten.  Lipid panel obtained.  Blood pressure borderline but technically at goal.  Continue current regimen  No orders of the defined types were placed in this encounter.  No orders of the defined types were placed in this encounter.    [name], DO [place] [phone]

## 2020-08-05 NOTE — Progress Notes (Signed)
Pt r/c.

## 2020-08-13 ENCOUNTER — Ambulatory Visit: Payer: Medicare Other | Admitting: Physician Assistant

## 2020-08-24 ENCOUNTER — Other Ambulatory Visit: Payer: Self-pay | Admitting: Family Medicine

## 2020-10-23 ENCOUNTER — Other Ambulatory Visit: Payer: Self-pay | Admitting: Family Medicine

## 2020-10-23 DIAGNOSIS — I10 Essential (primary) hypertension: Secondary | ICD-10-CM

## 2020-11-04 ENCOUNTER — Other Ambulatory Visit: Payer: Self-pay

## 2020-11-04 ENCOUNTER — Ambulatory Visit (INDEPENDENT_AMBULATORY_CARE_PROVIDER_SITE_OTHER): Payer: Medicare Other | Admitting: Family Medicine

## 2020-11-04 ENCOUNTER — Encounter: Payer: Self-pay | Admitting: Family Medicine

## 2020-11-04 VITALS — BP 135/87 | HR 84 | Temp 97.4°F | Ht 65.0 in | Wt 160.0 lb

## 2020-11-04 DIAGNOSIS — E1159 Type 2 diabetes mellitus with other circulatory complications: Secondary | ICD-10-CM | POA: Diagnosis not present

## 2020-11-04 DIAGNOSIS — E785 Hyperlipidemia, unspecified: Secondary | ICD-10-CM

## 2020-11-04 DIAGNOSIS — E1169 Type 2 diabetes mellitus with other specified complication: Secondary | ICD-10-CM | POA: Diagnosis not present

## 2020-11-04 DIAGNOSIS — Z23 Encounter for immunization: Secondary | ICD-10-CM

## 2020-11-04 DIAGNOSIS — F439 Reaction to severe stress, unspecified: Secondary | ICD-10-CM | POA: Diagnosis not present

## 2020-11-04 DIAGNOSIS — I152 Hypertension secondary to endocrine disorders: Secondary | ICD-10-CM | POA: Diagnosis not present

## 2020-11-04 DIAGNOSIS — Z794 Long term (current) use of insulin: Secondary | ICD-10-CM

## 2020-11-04 LAB — BAYER DCA HB A1C WAIVED: HB A1C (BAYER DCA - WAIVED): 7.9 % — ABNORMAL HIGH (ref 4.8–5.6)

## 2020-11-04 MED ORDER — ONETOUCH DELICA LANCETS 33G MISC: 11 refills | Status: DC

## 2020-11-04 MED ORDER — ONETOUCH DELICA LANCETS 33G MISC
11 refills | Status: AC
Start: 2020-11-04 — End: ?

## 2020-11-04 MED ORDER — GLUCOSE BLOOD VI STRP: ORAL_STRIP | 12 refills | Status: DC

## 2020-11-04 NOTE — Progress Notes (Signed)
Subjective: CC:DM PCP: [name], DO TIR:WERXV Tami Villanueva is a 80 y.o. female presenting to clinic today for:  1. Type 2 Diabetes with hypertension, hyperlipidemia:  Increased to 26 u of her insulin last visit.  Continued on Onglyza 56m.  She notes that she has developed diffuse itching with the Onglyza.  She skipped it for a day or 2 and noticed that the itching resolved.  Currently, she is not checking blood sugars because she does not have the correct testing supplies at home.  She is asking for refills on her One Touch Verio testing strips and her One Touch delica lancets.  These need to be sent to [facility] and if another prescription can be sent to her mail order she appreciated.  Denies any chest pain, shortness of breath, dizziness, sensory changes.  She continues to have quite a bit of stress surrounding her family members.  She has not yet called social services with regards to her sister, who continues to suffer from neglect.  Last eye exam: DUE Last foot exam: UTD Last A1c:  Lab Results  Component Value Date   HGBA1C 7.7 (H) 08/05/2020   Nephropathy screen indicated?: UTD Last flu, zoster and/or pneumovax:  Immunization History  Administered Date(s) Administered   Fluad Quad(high Dose 65+) 10/25/2019   Influenza, High Dose Seasonal PF 10/20/2013, 11/03/2015, 10/01/2016, 10/25/2017, 09/25/2018   Moderna Sars-Covid-2 Vaccination 04/14/2019, 05/15/2019   Pneumococcal Conjugate-13 11/18/2013   Pneumococcal Polysaccharide-23 08/18/2015   Tdap 11/26/2010     ROS: Per HPI  Allergies  Allergen Reactions   Codeine Nausea And Vomiting   Iodine Hives   Metformin And Related     Lactic acidosis   Penicillins Other (See Comments)    Passed out   Sulfa Antibiotics Nausea And Vomiting   Past Medical History:  Diagnosis Date   Diabetes mellitus without complication ([place]    Hyperlipidemia    Hypertension    SCCA (squamous cell carcinoma) of skin 04/03/2019   KA TOP LEFT  SHOULDER TX P BX    Current Outpatient Medications:    atorvastatin (LIPITOR) 20 MG tablet, TAKE 1 TABLET BY MOUTH  DAILY, Disp: 90 tablet, Rfl: 0   blood glucose meter kit and supplies, Dispense based on patient and insurance preference. Use up to four times daily as directed. (FOR ICD-10 E10.9, E11.9)., Disp: 1 each, Rfl: 0   Blood Glucose Monitoring Suppl (ONE TOUCH ULTRA 2) w/Device KIT, Test blood sugars four times daily Dx E11.65, Disp: 1 kit, Rfl: 0   glucose blood (ONE TOUCH ULTRA TEST) test strip, Test 1 x pr day and prn Dx E11.65, Disp: 100 each, Rfl: 3   Insulin Glargine (BASAGLAR KWIKPEN) 100 UNIT/ML, Inject 22-36 Units into the skin daily., Disp: 30 mL, Rfl: 0   lisinopril (ZESTRIL) 10 MG tablet, TAKE 1 TABLET BY MOUTH  DAILY, Disp: 90 tablet, Rfl: 0   NYAMYC powder, APPLY 1 APPLICATION  TOPICALLY 3 TIMES DAILY FOR 7 TO 10 DAYS AS NEEDED FOR  BREAST RASH, Disp: 45 g, Rfl: 0   ONETOUCH DELICA LANCETS 340GMISC, 1 daily and prn to check blood sugar, Disp: 100 each, Rfl: 3   OneTouch Delica Lancets 386PMISC, Test blood sugar two times daily. Dx:  E11.65, Disp: 100 each, Rfl: 11   saxagliptin HCl (ONGLYZA) 5 MG TABS tablet, Take 1 tablet (5 mg total) by mouth daily., Disp: 30 tablet, Rfl: 12 Social History   Socioeconomic History   Marital status: Married  Spouse name: Not on file   Number of children: 2   Years of education: 60   Highest education level: 12th grade  Occupational History   Occupation: employed  Tobacco Use   Smoking status: Never   Smokeless tobacco: Never  Vaping Use   Vaping Use: Never used  Substance and Sexual Activity   Alcohol use: No   Drug use: No   Sexual activity: Not Currently  Other Topics Concern   Not on file  Social History Narrative   Lives at home with her husband. He prepares most of their meals and they have a great relationship. She has one daughter and her son passed away a few years ago from complications with pneumonia. She works  at [employer] in [place] over 40 hours a week. 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    Social Determinants of Health   Financial Resource Strain: Not on file  Food Insecurity: Not on file  Transportation Needs: Not on file  Physical Activity: Not on file  Stress: Not on file  Social Connections: Not on file  Intimate Partner Violence: Not on file   Family History  Problem Relation Age of Onset   Diabetes Father    Heart disease Father    Heart disease Sister    COPD Sister    Heart disease Sister    Diabetes Son    Pneumonia Son     Objective: Office vital signs reviewed. BP 135/87   Pulse 84   Temp (!) 97.4 F (36.3 C)   Ht _0  (1.651 m)   Wt 160 lb (72.6 kg)   SpO2 97%   BMI 26.63 kg/m   Physical Examination:  General: Awake, alert, well nourished, No acute distress HEENT: Normal, sclera white Cardio: regular rate and rhythm, S1S2 heard, no murmurs appreciated Pulm: clear to auscultation bilaterally, no wheezes, rhonchi or rales; normal work of breathing on room air  Depression screen [facility] 2/9 11/04/2020 08/05/2020 05/05/2020  Decreased Interest 0 0 0  Down, Depressed, Hopeless 0 0 0  PHQ - 2 Score 0 0 0  Altered sleeping - - -  Tired, decreased energy - - -  Change in appetite - - -  Feeling bad or failure about yourself  - - -  Trouble concentrating - - -  Moving slowly or fidgety/restless - - -  Suicidal thoughts - - -  PHQ-9 Score - - -  Some recent data might be hidden   GAD 7 : Generalized Anxiety Score 11/04/2020 08/05/2020  Nervous, Anxious, on Edge 0 0  Control/stop worrying 0 0  Worry too much - different things 0 0  Trouble relaxing 0 0  Restless 0 0  Easily annoyed or irritable 0 0  Afraid - awful might happen 0 0  Total GAD 7 Score 0 0  Anxiety Difficulty Not difficult at all Not difficult at all    Assessment/ Plan: 80 y.o. female   Type 2 diabetes mellitus with other specified complication, with  long-term current use of insulin (HCC) - Plan: Bayer DCA Hb A1c Waived, glucose blood test strip, OneTouch Delica Lancets 41D MISC  Hyperlipidemia associated with type 2 diabetes mellitus ([place])  Hypertension associated with diabetes ([place])  Stress  Sugar is technically at goal for age since she is over 83 years old.  However, I would really like to keep her at a tighter control with A1c of 7.5 or less.  I have advised her to  increase her insulin to 28 units and then continue to increase by 1 unit every 2 days for fasting blood sugars above 150.  Okay to discontinue Onglyza since it sounds like she is having some type of allergic reaction to it.  Would like her to follow-up with Almyra Free for consideration of Dexcom versus freestyle libre.  Her supplies have been sent to her pharmacy.  She will continue statin.  Not yet due for fasting lipid  Blood pressure under good control.  No changes needed.  Continue ACE inhibitor  Continues to struggle with stress and she attributes a lot of her rise in blood sugar to this.  Recommend counseling services, coping skills.  I gave her the telephone number for adult protective services for [facility].  No orders of the defined types were placed in this encounter.  No orders of the defined types were placed in this encounter.    [name], DO [place] [phone]

## 2020-11-10 MED ORDER — GLUCOSE BLOOD VI STRP
ORAL_STRIP | 12 refills | Status: DC
Start: 2020-11-10 — End: 2021-02-22

## 2020-11-10 NOTE — Addendum Note (Signed)
Addended by: Antonietta Barcelona D on: 11/10/2020 10:11 AM   Modules accepted: Orders

## 2020-11-17 ENCOUNTER — Other Ambulatory Visit: Payer: Self-pay | Admitting: Family Medicine

## 2020-12-04 ENCOUNTER — Telehealth: Payer: Self-pay | Admitting: Family Medicine

## 2020-12-04 NOTE — Telephone Encounter (Signed)
Left message for patient to call back and schedule Medicare Annual Wellness Visit (AWV) to be completed by video or phone.   Last AWV: 12/26/2018  Please schedule at anytime with [facility] Health Advisor.  45 minute appointment  Any questions, please contact me at [phone]

## 2021-01-11 ENCOUNTER — Other Ambulatory Visit: Payer: Self-pay | Admitting: Family Medicine

## 2021-01-11 DIAGNOSIS — I10 Essential (primary) hypertension: Secondary | ICD-10-CM

## 2021-01-13 ENCOUNTER — Telehealth: Payer: Self-pay | Admitting: Family Medicine

## 2021-01-13 NOTE — Telephone Encounter (Signed)
Patient declined the Medicare Wellness Visit with [facility]  

## 2021-02-22 ENCOUNTER — Ambulatory Visit: Payer: Medicare Other

## 2021-02-22 ENCOUNTER — Ambulatory Visit (INDEPENDENT_AMBULATORY_CARE_PROVIDER_SITE_OTHER): Payer: Medicare Other | Admitting: Family Medicine

## 2021-02-22 ENCOUNTER — Encounter: Payer: Self-pay | Admitting: Family Medicine

## 2021-02-22 VITALS — BP 158/96 | HR 84 | Temp 98.3°F | Ht 65.0 in | Wt 163.0 lb

## 2021-02-22 DIAGNOSIS — Z794 Long term (current) use of insulin: Secondary | ICD-10-CM

## 2021-02-22 DIAGNOSIS — E1169 Type 2 diabetes mellitus with other specified complication: Secondary | ICD-10-CM

## 2021-02-22 DIAGNOSIS — R7309 Other abnormal glucose: Secondary | ICD-10-CM | POA: Diagnosis not present

## 2021-02-22 LAB — BAYER DCA HB A1C WAIVED: HB A1C (BAYER DCA - WAIVED): 9 % — ABNORMAL HIGH (ref 4.8–5.6)

## 2021-02-22 MED ORDER — GLUCOSE BLOOD VI STRP: ORAL_STRIP | 12 refills | Status: AC

## 2021-02-22 NOTE — Progress Notes (Signed)
Subjective: CC: Hypoglycemia PCP: Tami Norlander, DO PPJ:KDTOI Tami Villanueva is a 81 y.o. female presenting to clinic today for:  1.  Hypoglycemia Patient is compliant with Onglyza, 28 units daily of Basaglar.  She notes that sometimes her Basaglar does not inject all the way into her abdomen and says she will reinjected.  She is been having some intermittent hypoglycemic episodes into the 50s and occasionally 70s.  She notes that she gets cold sweats and this is what prompted her to check her blood sugar.  Her energy has been lower as of late as well.   ROS: Per HPI  Allergies  Allergen Reactions   Codeine Nausea And Vomiting   Iodine Hives   Metformin And Related     Lactic acidosis   Penicillins Other (See Comments)    Passed out   Sulfa Antibiotics Nausea And Vomiting   Past Medical History:  Diagnosis Date   Diabetes mellitus without complication (HCC)    Hyperlipidemia    Hypertension    SCCA (squamous cell carcinoma) of skin 04/03/2019   KA TOP LEFT SHOULDER TX P BX    Current Outpatient Medications:    atorvastatin (LIPITOR) 20 MG tablet, TAKE 1 TABLET BY MOUTH  DAILY, Disp: 90 tablet, Rfl: 1   glucose blood test strip, Test BGs up to 2 times daily E11.65 (one touch verio), Disp: 100 each, Rfl: 12   Insulin Glargine (BASAGLAR KWIKPEN) 100 UNIT/ML, Inject 22-36 Units into the skin daily., Disp: 30 mL, Rfl: 0   lisinopril (ZESTRIL) 10 MG tablet, TAKE 1 TABLET BY MOUTH  DAILY, Disp: 90 tablet, Rfl: 0   NYAMYC powder, APPLY 1 APPLICATION  TOPICALLY 3 TIMES DAILY FOR 7 TO 10 DAYS AS NEEDED FOR  BREAST RASH, Disp: 45 g, Rfl: 0   OneTouch Delica Lancets 71I MISC, Test blood sugar two times daily. Dx:  E11.65, Disp: 100 each, Rfl: 11   saxagliptin HCl (ONGLYZA) 5 MG TABS tablet, Take 1 tablet (5 mg total) by mouth daily., Disp: 30 tablet, Rfl: 12 Social History   Socioeconomic History   Marital status: Married    Spouse name: Not on file   Number of children: 2   Years  of education: 12   Highest education level: 12th grade  Occupational History   Occupation: employed  Tobacco Use   Smoking status: Never   Smokeless tobacco: Never  Vaping Use   Vaping Use: Never used  Substance and Sexual Activity   Alcohol use: No   Drug use: No   Sexual activity: Not Currently  Other Topics Concern   Not on file  Social History Narrative   Lives at home with her husband. He prepares most of their meals and they have a great relationship. She has one daughter and her son passed away a few years ago from complications with pneumonia. She works at [employer] in [place] over 40 hours a week. 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    Social Determinants of Health   Financial Resource Strain: Not on file  Food Insecurity: Not on file  Transportation Needs: Not on file  Physical Activity: Not on file  Stress: Not on file  Social Connections: Not on file  Intimate Partner Violence: Not on file   Family History  Problem Relation Age of Onset   Diabetes Father    Heart disease Father    Heart disease Sister    COPD Sister    Heart disease  Sister    Diabetes Son    Pneumonia Son     Objective: Office vital signs reviewed. BP (!) 158/96    Pulse 84    Temp 98.3 F (36.8 C)    Ht 5\' 5"  (1.651 m)    Wt 163 lb (73.9 kg)    SpO2 97%    BMI 27.12 kg/m   Physical Examination:  General: Awake, alert, nontoxic, No acute distress Neuro: Alert and oriented x3.  No focal neurologic deficits  Assessment/ Plan: 81 y.o. female   Type 2 diabetes mellitus with other specified complication, with long-term current use of insulin ([place]) - Plan: Bayer DCA Hb A1c Waived  Labile blood glucose  Very confounding situation given A1c 9.0 today.  Reporting intermittent hypoglycemic episodes into the 50s.  I wonder if perhaps she is over injecting her insulin.  She had reported some leakage of the insulin and she would subsequently reinject.   Other days, she would wake up with a low blood sugar and not inject at all.  She has been started on a freestyle libre today so that we can get a better idea of what her blood sugars are running over the next 10 days.  In the interim I would like her to back down on her insulin to 26 units daily.  I have sent in new testing supplies for spotcheck should she get any unusual readings that require fingerstick confirmation.  We will get her set up with [facility] in the next 10 days to have her meter downloaded.  I wonder if she might not benefit from something like a GLP instead?  Orders Placed This Encounter  Procedures   Bayer DCA Hb A1c Waived   No orders of the defined types were placed in this encounter.    Tami Norlander, DO [place] [phone]

## 2021-02-23 ENCOUNTER — Telehealth: Payer: Self-pay | Admitting: Pharmacist

## 2021-02-23 NOTE — Telephone Encounter (Signed)
°  Care Management   Follow Up Note   02/23/2021 Name: Tami Villanueva MRN: [id number] DOB: December 13, 1940   Referred by: [name], DO Reason for referral : Appointment (F/u hypoglycemia)   An unsuccessful telephone outreach was attempted today. The patient was referred to the case management team for assistance with care management and care coordination.   Follow Up Plan: The care management team will reach out to the patient again over the next 7 days.    Regina Eck, PharmD, BCPS Clinical Pharmacist, [place]  II Phone [phone]

## 2021-03-29 ENCOUNTER — Other Ambulatory Visit: Payer: Self-pay | Admitting: Family Medicine

## 2021-03-29 DIAGNOSIS — I10 Essential (primary) hypertension: Secondary | ICD-10-CM

## 2021-04-06 ENCOUNTER — Telehealth: Payer: Self-pay

## 2021-04-06 NOTE — Telephone Encounter (Signed)
Advised pt sugar at 77

## 2021-04-07 ENCOUNTER — Ambulatory Visit: Payer: Medicare Other | Admitting: Family Medicine

## 2021-04-09 ENCOUNTER — Encounter: Payer: Self-pay | Admitting: Family Medicine

## 2021-04-16 ENCOUNTER — Telehealth: Payer: Self-pay | Admitting: Pharmacist

## 2021-04-16 DIAGNOSIS — E1169 Type 2 diabetes mellitus with other specified complication: Secondary | ICD-10-CM

## 2021-04-16 DIAGNOSIS — E785 Hyperlipidemia, unspecified: Secondary | ICD-10-CM

## 2021-04-16 MED ORDER — SAXAGLIPTIN HCL 5 MG PO TABS: 5.0000 mg | ORAL_TABLET | Freq: Every day | ORAL | 5 refills | Status: DC

## 2021-04-16 NOTE — Telephone Encounter (Signed)
ONGLYZA REFILLS SENT TO [facility] ?

## 2021-04-29 ENCOUNTER — Telehealth: Payer: Self-pay | Admitting: Family Medicine

## 2021-04-29 NOTE — Telephone Encounter (Signed)
PT AWARE DOES NOT NEED TO FAST ?

## 2021-04-30 ENCOUNTER — Encounter: Payer: Self-pay | Admitting: Family Medicine

## 2021-04-30 ENCOUNTER — Ambulatory Visit (INDEPENDENT_AMBULATORY_CARE_PROVIDER_SITE_OTHER): Payer: Medicare Other | Admitting: Family Medicine

## 2021-04-30 ENCOUNTER — Telehealth: Payer: Self-pay

## 2021-04-30 VITALS — BP 139/82 | HR 89 | Temp 97.2°F | Ht 65.0 in | Wt 167.0 lb

## 2021-04-30 DIAGNOSIS — M79675 Pain in left toe(s): Secondary | ICD-10-CM | POA: Diagnosis not present

## 2021-04-30 DIAGNOSIS — R7309 Other abnormal glucose: Secondary | ICD-10-CM | POA: Diagnosis not present

## 2021-04-30 DIAGNOSIS — H6981 Other specified disorders of Eustachian tube, right ear: Secondary | ICD-10-CM

## 2021-04-30 DIAGNOSIS — E1169 Type 2 diabetes mellitus with other specified complication: Secondary | ICD-10-CM

## 2021-04-30 DIAGNOSIS — Z794 Long term (current) use of insulin: Secondary | ICD-10-CM

## 2021-04-30 MED ORDER — FREESTYLE LIBRE 14 DAY SENSOR MISC: 1.0000 [IU] | Freq: Every day | 12 refills | Status: DC

## 2021-04-30 MED ORDER — FREESTYLE LIBRE READER DEVI: 1.0000 [IU] | Freq: Every day | 1 refills | Status: AC

## 2021-04-30 MED ORDER — RYBELSUS 3 MG PO TABS: 3.0000 mg | ORAL_TABLET | Freq: Every day | ORAL | 0 refills | Status: DC

## 2021-04-30 NOTE — Telephone Encounter (Signed)
PT IS INTERESTED IN DEXCOM OR LIBRE, HAVING ISSUES WITH BASAGLAR. ?

## 2021-04-30 NOTE — Progress Notes (Signed)
? ?Subjective: ?CC: Labile blood sugars ?PCP: [name], DO ?PYK:DXIPJ Tami Villanueva is a 81 y.o. female presenting to clinic today for: ? ?1.  Labile blood sugars ?Patient reports that she continues to have lability in her blood sugars.  Sometimes her blood sugars will get into the 70s and 80s and she feels bad during this time.  She subsequently does not try and get her blood sugars into normal range because she feels better when they are high.  She is utilizing the insulin glargine and the Onglyza as directed.  She admits that the insulin does not always inject fully and sometimes will come back out after an injection.  She also reports that she has quite a bit of pain in her fingertips due to the frequent blood testing. ? ?2.  Right ear fullness ?Patient reports that she gets right ear fullness.  When she presses against her jawline it seems to improve the ear fullness and she can hear better.  Denies any drainage or pain. ? ?3.  Foot pain ?Patient reports that she has been having some rubbing of the her toes on the left foot that cause it to be raw.  She has been having to wrap one of the toes in efforts to get them not to rub against each other ? ? ?ROS: Per HPI ? ?Allergies  ?Allergen Reactions  ? Codeine Nausea And Vomiting  ? Iodine Hives  ? Metformin And Related   ?  Lactic acidosis  ? Penicillins Other (See Comments)  ?  Passed out  ? Sulfa Antibiotics Nausea And Vomiting  ? ?Past Medical History:  ?Diagnosis Date  ? Diabetes mellitus without complication ([place])   ? Hyperlipidemia   ? Hypertension   ? SCCA (squamous cell carcinoma) of skin 04/03/2019  ? KA TOP LEFT SHOULDER TX P BX  ? ? ?Current Outpatient Medications:  ?  atorvastatin (LIPITOR) 20 MG tablet, TAKE 1 TABLET BY MOUTH  DAILY, Disp: 90 tablet, Rfl: 1 ?  glucose blood test strip, Test BGs up to 4 times daily E11.65 (one touch verio), Disp: 400 each, Rfl: 12 ?  Insulin Glargine (BASAGLAR KWIKPEN) 100 UNIT/ML, Inject 22-36 Units into the skin  daily., Disp: 30 mL, Rfl: 0 ?  lisinopril (ZESTRIL) 10 MG tablet, TAKE 1 TABLET BY MOUTH DAILY, Disp: 90 tablet, Rfl: 0 ?  NYAMYC powder, APPLY 1 APPLICATION  TOPICALLY 3 TIMES DAILY FOR 7 TO 10 DAYS AS NEEDED FOR  BREAST RASH, Disp: 45 g, Rfl: 0 ?  OneTouch Delica Lancets 82N MISC, Test blood sugar two times daily. Dx:  E11.65, Disp: 100 each, Rfl: 11 ?  saxagliptin HCl (ONGLYZA) 5 MG TABS tablet, Take 1 tablet (5 mg total) by mouth daily., Disp: 90 tablet, Rfl: 5 ?Social History  ? ?Socioeconomic History  ? Marital status: Married  ?  Spouse name: Not on file  ? Number of children: 2  ? Years of education: 45  ? Highest education level: 12th grade  ?Occupational History  ? Occupation: employed  ?Tobacco Use  ? Smoking status: Never  ? Smokeless tobacco: Never  ?Vaping Use  ? Vaping Use: Never used  ?Substance and Sexual Activity  ? Alcohol use: No  ? Drug use: No  ? Sexual activity: Not Currently  ?Other Topics Concern  ? Not on file  ?Social History Narrative  ? Lives at home with her husband. He prepares most of their meals and they have a great relationship. She has one daughter  and her son passed away a few years ago from complications with pneumonia. She works at [employer] in [place] over 40 hours a week. 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   ? ?Social Determinants of Health  ? ?Financial Resource Strain: Not on file  ?Food Insecurity: Not on file  ?Transportation Needs: Not on file  ?Physical Activity: Not on file  ?Stress: Not on file  ?Social Connections: Not on file  ?Intimate Partner Violence: Not on file  ? ?Family History  ?Problem Relation Age of Onset  ? Diabetes Father   ? Heart disease Father   ? Heart disease Sister   ? COPD Sister   ? Heart disease Sister   ? Diabetes Son   ? Pneumonia Son   ? ? ?Objective: ?Office vital signs reviewed. ?BP 139/82   Pulse 89   Temp (!) 97.2 ?F (36.2 ?C)   Ht '5\' 5"'$  (1.651 m)   Wt 167 lb (75.8 kg)   SpO2 96%   BMI  27.79 kg/m?  ? ?Physical Examination:  ?General: Awake, alert, well nourished, nontoxic. No acute distress ?HEENT: Sclera white.  Moist mucous membranes.  No nasal drainage or facial swelling appreciated ?Cardio: Regular rate and rhythm ?Pulm: Normal work of breathing on room air, no wheezes ?MSK: Ambulating independently. ? ?Assessment/ Plan: ?81 y.o. female  ? ?Type 2 diabetes mellitus with other specified complication, with long-term current use of insulin ([place]) - Plan: Semaglutide (RYBELSUS) 3 MG TABS, Continuous Blood Gluc Sensor (FREESTYLE LIBRE 14 DAY SENSOR) MISC ? ?Labile blood glucose - Plan: Continuous Blood Gluc Sensor (FREESTYLE LIBRE 14 DAY SENSOR) MISC, Continuous Blood Gluc Receiver (FREESTYLE LIBRE READER) DEVI ? ?Dysfunction of right eustachian tube ? ?Pain in left toe(s) ? ?Sugar has not been well controlled and it sounds like she continues to have relative hypoglycemia which is a barrier to her being compliant with her medications as she should.  She is also having difficulty administering the insulin at this point due to what I suspect to be scarring from many years of injections.  I am going to see if we can start transitioning her over to the GLP to minimize sticks.  I anticipate we will need to transition her over to [place] or similar in efforts to get adequate control of her blood sugar, particularly if we are going to be discontinuing daily insulin.  I have discussed her care with our clinical pharmacist, and we will arrange an appointment to follow-up in the next couple of weeks with her for patient assistance and to check up on how patient is doing with these medications.  We discussed the risks of uncontrolled blood sugar versus risks of relative hypoglycemia.  I think that the risks of uncontrolled blood sugar but certainly outweigh the risks of relative hypoglycemia at this point.  I am also going to try and arrange a freestyle libre for her since her blood sugars are so labile ? ?We  discussed that the symptoms she is experiencing in the right ear likely eustachian tube dysfunction. ? ?Regarding the pain in her left toes I did offer referral to podiatry but she would like to hold off on this for now. ?  Benefit from orthotics ? ?No orders of the defined types were placed in this encounter. ? ?No orders of the defined types were placed in this encounter. ? ? ? ?[name], DO ?[place] ?[phone] ? ?

## 2021-05-04 ENCOUNTER — Other Ambulatory Visit: Payer: Self-pay | Admitting: Family Medicine

## 2021-05-07 NOTE — Telephone Encounter (Signed)
Tami Villanueva, ? ?Spoke with Patient and she states that the medication is making her feel very nauseas and feels really bad.  ?Her sugars are 229 first thing in the AM prior to eating. ? ?Please advise. ? ?Thank you, ?Noreene Larsson, RMA ?Care Guide, Embedded Care Coordination ?[facility]  ?[place], [place] [address] ?Direct Dial: [phone] ?[email or web address] ?Website: [email or web address]  ? ?

## 2021-05-10 NOTE — Telephone Encounter (Signed)
If this is causing nausea at this low of a dose, then her only options are the insulin and onglyza that was being prescribed. ?

## 2021-05-18 NOTE — Telephone Encounter (Signed)
Does she need appt with me? Sorry , trying to catch up on messages ?

## 2021-06-01 ENCOUNTER — Ambulatory Visit (INDEPENDENT_AMBULATORY_CARE_PROVIDER_SITE_OTHER): Payer: Medicare Other | Admitting: Family Medicine

## 2021-06-01 ENCOUNTER — Encounter: Payer: Self-pay | Admitting: Family Medicine

## 2021-06-01 VITALS — BP 144/83 | HR 77 | Temp 98.2°F | Ht 65.0 in | Wt 164.6 lb

## 2021-06-01 DIAGNOSIS — I152 Hypertension secondary to endocrine disorders: Secondary | ICD-10-CM | POA: Diagnosis not present

## 2021-06-01 DIAGNOSIS — Z794 Long term (current) use of insulin: Secondary | ICD-10-CM | POA: Diagnosis not present

## 2021-06-01 DIAGNOSIS — E1159 Type 2 diabetes mellitus with other circulatory complications: Secondary | ICD-10-CM | POA: Diagnosis not present

## 2021-06-01 DIAGNOSIS — E785 Hyperlipidemia, unspecified: Secondary | ICD-10-CM | POA: Diagnosis not present

## 2021-06-01 DIAGNOSIS — E1169 Type 2 diabetes mellitus with other specified complication: Secondary | ICD-10-CM | POA: Diagnosis not present

## 2021-06-01 LAB — BAYER DCA HB A1C WAIVED: HB A1C (BAYER DCA - WAIVED): 9.5 % — ABNORMAL HIGH (ref 4.8–5.6)

## 2021-06-01 NOTE — Progress Notes (Signed)
? ?Subjective: ?CC:DM recheck ?PCP: Tami Norlander, DO ?Tami Villanueva is a 81 y.o. female presenting to clinic today for: ? ?1. Type 2 Diabetes with hypertension, hyperlipidemia:  ?She reports that she had totally discontinued her insulin while on the Rybelsus 3 mg.  She only stayed on the Rybelsus for a short while because she started developing itching of her arms.  She subsequently has gone back to her [name] and is currently injecting 25 units.  This morning her blood sugar was around 140s.  She gives me her One Touch Verio to review and there are several gaps in monitoring.  Her blood sugars range anywhere from 77 to upper 300s.  No hypoglycemic episodes.  She had issues with Onglyza causing similar itching of the arms.  She is intolerant to metformin secondary to history of lactic acidosis.  She has had relative hypoglycemia due to having uncontrolled blood sugar for so long and has often been disinclined to continue certain medications or push her insulin dose for this reason. ? ?Last eye exam: Needs ?Last foot exam: Needs ?Last A1c:  ?Lab Results  ?Component Value Date  ? HGBA1C 9.0 (H) 02/22/2021  ? ?Nephropathy screen indicated?:  On ACE inhibitor ?Last flu, zoster and/or pneumovax:  ?Immunization History  ?Administered Date(s) Administered  ? Fluad Quad(high Dose 65+) 10/25/2019, 11/04/2020  ? Influenza, High Dose Seasonal PF 10/20/2013, 11/03/2015, 10/01/2016, 10/25/2017, 09/25/2018  ? Moderna Sars-Covid-2 Vaccination 04/14/2019, 05/15/2019  ? Pneumococcal Conjugate-13 11/18/2013  ? Pneumococcal Polysaccharide-23 08/18/2015  ? Tdap 11/26/2010  ? ? ?ROS: No chest pain, shortness of breath, visual disturbance reported. ? ? ? ?ROS: Per HPI ? ?Allergies  ?Allergen Reactions  ? Codeine Nausea And Vomiting  ? Iodine Hives  ? Metformin And Related   ?  Lactic acidosis  ? Penicillins Other (See Comments)  ?  Passed out  ? Sulfa Antibiotics Nausea And Vomiting  ? ?Past Medical History:  ?Diagnosis Date   ? Diabetes mellitus without complication ([place])   ? Hyperlipidemia   ? Hypertension   ? SCCA (squamous cell carcinoma) of skin 04/03/2019  ? KA TOP LEFT SHOULDER TX P BX  ? ? ?Current Outpatient Medications:  ?  atorvastatin (LIPITOR) 20 MG tablet, TAKE 1 TABLET BY MOUTH ONCE DAILY, Disp: 90 tablet, Rfl: 0 ?  Continuous Blood Gluc Receiver (FREESTYLE LIBRE READER) DEVI, 1 Units by Does not apply route daily. UAD to test BGs daily. Dx E11.40, Disp: 1 each, Rfl: 1 ?  Continuous Blood Gluc Sensor (FREESTYLE LIBRE 14 DAY SENSOR) MISC, 1 Units by Does not apply route daily. UAD to check blood sugars R73.09, Disp: 2 each, Rfl: 12 ?  glucose blood test strip, Test BGs up to 4 times daily E11.65 (one touch verio), Disp: 400 each, Rfl: 12 ?  Insulin Glargine (BASAGLAR KWIKPEN) 100 UNIT/ML, Inject 22-36 Units into the skin daily., Disp: 30 mL, Rfl: 0 ?  lisinopril (ZESTRIL) 10 MG tablet, TAKE 1 TABLET BY MOUTH DAILY, Disp: 90 tablet, Rfl: 0 ?  NYAMYC powder, APPLY 1 APPLICATION  TOPICALLY 3 TIMES DAILY FOR 7 TO 10 DAYS AS NEEDED FOR  BREAST RASH, Disp: 45 g, Rfl: 0 ?  OneTouch Delica Lancets 47W MISC, Test blood sugar two times daily. Dx:  E11.65, Disp: 100 each, Rfl: 11 ?  Semaglutide (RYBELSUS) 3 MG TABS, Take 3 mg by mouth daily., Disp: 30 tablet, Rfl: 0 ?Social History  ? ?Socioeconomic History  ? Marital status: Married  ?  Spouse name: Not  on file  ? Number of children: 2  ? Years of education: 55  ? Highest education level: 12th grade  ?Occupational History  ? Occupation: employed  ?Tobacco Use  ? Smoking status: Never  ? Smokeless tobacco: Never  ?Vaping Use  ? Vaping Use: Never used  ?Substance and Sexual Activity  ? Alcohol use: No  ? Drug use: No  ? Sexual activity: Not Currently  ?Other Topics Concern  ? Not on file  ?Social History Narrative  ? Lives at home with her husband. He prepares most of their meals and they have a great relationship. She has one daughter and her son passed away a few years ago from  complications with pneumonia. She works at [employer] in [place] over 40 hours a week. 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   ? ?Social Determinants of Health  ? ?Financial Resource Strain: Not on file  ?Food Insecurity: Not on file  ?Transportation Needs: Not on file  ?Physical Activity: Not on file  ?Stress: Not on file  ?Social Connections: Not on file  ?Intimate Partner Violence: Not on file  ? ?Family History  ?Problem Relation Age of Onset  ? Diabetes Father   ? Heart disease Father   ? Heart disease Sister   ? COPD Sister   ? Heart disease Sister   ? Diabetes Son   ? Pneumonia Son   ? ? ?Objective: ?Office vital signs reviewed. ?BP (!) 144/83   Pulse 77   Temp 98.2 ?F (36.8 ?C)   Ht '5\' 5"'$  (1.651 m)   Wt 164 lb 9.6 oz (74.7 kg)   SpO2 95%   BMI 27.39 kg/m?  ? ?Physical Examination:  ?General: Awake, alert, well nourished, No acute distress ?HEENT: sclera white, MMM ?Cardio: regular rate and rhythm  ?Pulm: Normal work of breathing on room air ? ?Assessment/ Plan: ?81 y.o. female  ? ?Type 2 diabetes mellitus with other specified complication, with long-term current use of insulin ([place]) - Plan: Bayer DCA Hb A1c Waived, Ambulatory referral to Endocrinology ? ?Hyperlipidemia associated with type 2 diabetes mellitus ([place]) ? ?Hypertension associated with diabetes ([place]) ? ?Sugar remains grossly uncontrolled and fortunately has risen even further to 9.5.  She unfortunately did not follow-up with clinical pharmacist as I directed during her last visit.  She has had intolerances or reactions to multiple medications at this point and really not sure where to go with this besides adding mealtime insulin.  She seems very resistant to this.  I am going to place a referral to endocrinology for further evaluation and recommendations.  For now, continue basal insulin at 25 units nightly.  She understands that she should increase by 1 unit every 2 days for fasting blood sugars above  150.  In need of diabetic foot exam but the majority of our appointment today was spent counseling and trying to develop a plan ? ?Not yet due for fasting lipid.  Continue statin ? ?Blood pressure is at goal for age.  No changes ? ?No orders of the defined types were placed in this encounter. ? ?No orders of the defined types were placed in this encounter. ? ? ? ?Tami Norlander, DO ?[place] ?[phone] ? ? ?

## 2021-06-21 ENCOUNTER — Other Ambulatory Visit: Payer: Self-pay | Admitting: Family Medicine

## 2021-06-21 DIAGNOSIS — I10 Essential (primary) hypertension: Secondary | ICD-10-CM

## 2021-06-23 ENCOUNTER — Telehealth: Payer: Self-pay | Admitting: Family Medicine

## 2021-06-23 DIAGNOSIS — E1165 Type 2 diabetes mellitus with hyperglycemia: Secondary | ICD-10-CM

## 2021-06-24 MED ORDER — BASAGLAR KWIKPEN 100 UNIT/ML ~~LOC~~ SOPN: 22.0000 [IU] | PEN_INJECTOR | Freq: Every day | SUBCUTANEOUS | 5 refills | Status: DC

## 2021-06-24 NOTE — Telephone Encounter (Signed)
Refills sent to [facility] [employer] for Basaglar (equivalent to lantus) She can call [phone] to schedule shipment.  We have tresiba samples we could substitute if she needs something ASAP>  Thank you!

## 2021-07-07 ENCOUNTER — Telehealth: Payer: Self-pay | Admitting: Family Medicine

## 2021-07-07 ENCOUNTER — Encounter: Payer: Self-pay | Admitting: Family Medicine

## 2021-07-07 ENCOUNTER — Telehealth: Payer: Self-pay

## 2021-07-07 ENCOUNTER — Ambulatory Visit (INDEPENDENT_AMBULATORY_CARE_PROVIDER_SITE_OTHER): Payer: Medicare Other | Admitting: Family Medicine

## 2021-07-07 ENCOUNTER — Ambulatory Visit (INDEPENDENT_AMBULATORY_CARE_PROVIDER_SITE_OTHER): Payer: Medicare Other

## 2021-07-07 VITALS — BP 139/88 | HR 80 | Temp 97.7°F | Resp 20 | Ht 64.0 in | Wt 167.0 lb

## 2021-07-07 VITALS — Ht 64.0 in | Wt 164.0 lb

## 2021-07-07 DIAGNOSIS — I10 Essential (primary) hypertension: Secondary | ICD-10-CM

## 2021-07-07 DIAGNOSIS — Z794 Long term (current) use of insulin: Secondary | ICD-10-CM

## 2021-07-07 DIAGNOSIS — Z0000 Encounter for general adult medical examination without abnormal findings: Secondary | ICD-10-CM

## 2021-07-07 DIAGNOSIS — M25562 Pain in left knee: Secondary | ICD-10-CM

## 2021-07-07 DIAGNOSIS — E1165 Type 2 diabetes mellitus with hyperglycemia: Secondary | ICD-10-CM | POA: Diagnosis not present

## 2021-07-07 DIAGNOSIS — M25561 Pain in right knee: Secondary | ICD-10-CM

## 2021-07-07 DIAGNOSIS — E1169 Type 2 diabetes mellitus with other specified complication: Secondary | ICD-10-CM

## 2021-07-07 MED ORDER — ACETAMINOPHEN 500 MG PO TABS: 1000.0000 mg | ORAL_TABLET | Freq: Three times a day (TID) | ORAL | 2 refills | Status: AC | PRN

## 2021-07-07 MED ORDER — LISINOPRIL 10 MG PO TABS: 10.0000 mg | ORAL_TABLET | Freq: Every day | ORAL | 1 refills | Status: DC

## 2021-07-07 NOTE — Telephone Encounter (Signed)
CCM/Insulin assistance/Pharmacy order placed per Dr. Marjean Donna order. LM for pt to call office to advise. Mjp,lpn

## 2021-07-07 NOTE — Addendum Note (Signed)
Addended by: Randal Buba K on: 07/07/2021 04:40 PM   Modules accepted: Orders

## 2021-07-07 NOTE — Telephone Encounter (Signed)
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

## 2021-07-07 NOTE — Patient Instructions (Addendum)
Tylenol 1,000 mg three times daily for pain.   Voltaren gel for your knees.

## 2021-07-07 NOTE — Progress Notes (Signed)
Subjective:   Tami Villanueva is a 81 y.o. female who presents for Medicare Annual (Subsequent) preventive examination. Virtual Visit via Telephone Note  I connected with  Tami Villanueva on 07/07/21 at  2:45 PM EDT by telephone and verified that I am speaking with the correct person using two identifiers.  Location: Patient: HOME Provider: [facility] Persons participating in the virtual visit: patient/Nurse Health Advisor   I discussed the limitations, risks, security and privacy concerns of performing an evaluation and management service by telephone and the availability of in person appointments. The patient expressed understanding and agreed to proceed.  Interactive audio and video telecommunications were attempted between this nurse and patient, however failed, due to patient having technical difficulties OR patient did not have access to video capability.  We continued and completed visit with audio only.  Some vital signs may be absent or patient reported.   Chriss Driver, LPN  Review of Systems     Cardiac Risk Factors include: advanced age (>54mn, >>20women);hypertension;dyslipidemia;sedentary lifestyle     Objective:    Today's Vitals   07/07/21 1443 07/07/21 1446  Weight: 164 lb (74.4 kg)   Height: '5\' 4"'$  (1.626 m)   PainSc:  7    Body mass index is 28.15 kg/m.     07/07/2021    3:00 PM 12/26/2018   10:59 AM 12/22/2017   10:35 AM 09/30/2017   12:33 PM  Advanced Directives  Does Patient Have a Medical Advance Directive? No No No No  Would patient like information on creating a medical advance directive? No - Patient declined Yes (MAU/Ambulatory/Procedural Areas - Information given) Yes (MAU/Ambulatory/Procedural Areas - Information given)     Current Medications (verified) Outpatient Encounter Medications as of 07/07/2021  Medication Sig   atorvastatin (LIPITOR) 20 MG tablet TAKE 1 TABLET BY MOUTH ONCE DAILY   Continuous Blood Gluc Receiver (FREESTYLE LIBRE  READER) DEVI 1 Units by Does not apply route daily. UAD to test BGs daily. Dx E11.40   Continuous Blood Gluc Sensor (FREESTYLE LIBRE 14 DAY SENSOR) MISC 1 Units by Does not apply route daily. UAD to check blood sugars R73.09   glucose blood test strip Test BGs up to 4 times daily E11.65 (one touch verio)   Insulin Glargine (BASAGLAR KWIKPEN) 100 UNIT/ML Inject 22-36 Units into the skin daily.   lisinopril (ZESTRIL) 10 MG tablet TAKE 1 TABLET BY MOUTH DAILY   NYAMYC powder APPLY 1 APPLICATION  TOPICALLY 3 TIMES DAILY FOR 7 TO 10 DAYS AS NEEDED FOR  BREAST RASH   OneTouch Delica Lancets 369CMISC Test blood sugar two times daily. Dx:  E11.65   Semaglutide (RYBELSUS) 3 MG TABS Take 3 mg by mouth daily.   No facility-administered encounter medications on file as of 07/07/2021.    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 Family History  Problem Relation Age of Onset   Diabetes Father    Heart disease Father    Heart disease Sister    COPD Sister    Heart disease Sister    Diabetes Son    Pneumonia Son    Social History   Socioeconomic History  Marital status: Married    Spouse name: Not on file   Number of children: 2   Years of education: 67   Highest education level: 12th grade  Occupational History   Occupation: employed  Tobacco Use   Smoking status: Never   Smokeless tobacco: Never  Vaping Use   Vaping Use: Never used  Substance and Sexual Activity   Alcohol use: No   Drug use: No   Sexual activity: Not Currently  Other Topics Concern   Not on file  Social History Narrative   Lives at home with her husband. He prepares most of their meals and they have a great  relationship. She has one daughter and her son passed away a few years ago from complications with pneumonia. She works at [employer] in [place] over 40 hours a week. 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    Social Determinants of Health   Financial Resource Strain: Low Risk    Difficulty of Paying Living Expenses: Not hard at all  Food Insecurity: No Food Insecurity   Worried About Charity fundraiser in the Last Year: Never true   [place] in the Last Year: Never true  Transportation Needs: No Transportation Needs   Lack of Transportation (Medical): No   Lack of Transportation (Non-Medical): No  Physical Activity: Sufficiently Active   Days of Exercise per Week: 5 days   Minutes of Exercise per Session: 30 min  Stress: No Stress Concern Present   Feeling of Stress : Only a little  Social Connections: Engineer, building services of Communication with Friends and Family: More than three times a week   Frequency of Social Gatherings with Friends and Family: More than three times a week   Attends Religious Services: More than 4 times per year   Active Member of [employer] or Organizations: Yes   Attends Music therapist: More than 4 times per year   Marital Status: Married    Tobacco Counseling Counseling given: Not Answered   Clinical Intake:  Pre-visit preparation completed: Yes  Pain : 0-10 Pain Score: 7  Pain Type: Chronic pain Pain Location: Knee Pain Descriptors / Indicators: Aching, Dull Pain Onset: More than a month ago Pain Frequency: Intermittent     BMI - recorded: 28.15 Nutritional Status: BMI 25 -29 Overweight Nutritional Risks: None Diabetes: Yes  How often do you need to have someone help you when you read instructions, pamphlets, or other written materials from your doctor or pharmacy?: 1 - Never  Diabetic?Nutrition Risk Assessment:  Has the patient had any N/V/D within the last 2 months?  No   Does the patient have any non-healing wounds?  No  Has the patient had any unintentional weight loss or weight gain?  No   Diabetes:  Is the patient diabetic?  Yes  If diabetic, was a CBG obtained today?  No  Did the patient bring in their glucometer from home?  No  phone visit. How often do you monitor your CBG's? Daily.   Financial Strains and Diabetes Management:  Are you having any financial strains with the device, your supplies or your medication? No .  Does the patient want to be seen by Chronic Care Management for management of their diabetes?  Yes  Would the patient like to be referred to a Nutritionist or for Diabetic Management?  No   Diabetic Exams:  Diabetic Eye Exam: Completed 2021. Pt has been advised about  the importance in completing this exam.  Diabetic Foot Exam: Completed 05/05/2020. Pt has been advised about the importance in completing this exam.  Interpreter Needed?: No  Information entered by :: mj Roderica Cathell, lpn   Activities of Daily Living    07/07/2021    3:04 PM  In your present state of health, do you have any difficulty performing the following activities:  Hearing? 0  Vision? 0  Difficulty concentrating or making decisions? 0  Walking or climbing stairs? 0  Dressing or bathing? 0  Doing errands, shopping? 0  Preparing Food and eating ? N  Using the Toilet? N  In the past six months, have you accidently leaked urine? N  Do you have problems with loss of bowel control? N  Managing your Medications? N  Managing your Finances? N  Housekeeping or managing your Housekeeping? N    Patient Care Team: [name], DO as PCP - General (Family Medicine) Warren Danes, PA-C as Physician Assistant (Dermatology) Lavera Guise, [facility] (Pharmacist)  Indicate any recent Medical Services you may have received from other than [facility] providers in the past year (date may be approximate).     Assessment:   This is a routine wellness examination  for Tami Villanueva.  Hearing/Vision screen Hearing Screening - Comments:: Some hearing issues to R ear.  Vision Screening - Comments:: Glasses. [facility]. 2021.  Dietary issues and exercise activities discussed: Current Exercise Habits: Home exercise routine, Type of exercise: walking, Time (Minutes): 30, Frequency (Times/Week): 5, Weekly Exercise (Minutes/Week): 150, Intensity: Mild, Exercise limited by: cardiac condition(s)   Goals Addressed             This Visit's Progress    DIET - INCREASE WATER INTAKE   On track      Depression Screen    07/07/2021    2:51 PM 06/01/2021    8:44 AM 04/30/2021    1:26 PM 11/04/2020    8:22 AM 08/05/2020    8:30 AM 05/05/2020    9:25 AM 10/08/2019    8:14 AM  PHQ 2/9 Scores  PHQ - 2 Score 0 0 0 0 0 0 0    Fall Risk    07/07/2021    3:01 PM 06/01/2021    8:44 AM 04/30/2021    1:26 PM 11/04/2020    8:22 AM 08/05/2020    8:29 AM  [place] in the past year? 0 0 0 0 0  Number falls in past yr: 0      Injury with Fall? 0      Risk for fall due to : No Fall Risks      Follow up Falls prevention discussed        FALL RISK PREVENTION PERTAINING TO THE HOME:  Any stairs in or around the home? No  If so, are there any without handrails? No  Home free of loose throw rugs in walkways, pet beds, electrical cords, etc? Yes  Adequate lighting in your home to reduce risk of falls? Yes   ASSISTIVE DEVICES UTILIZED TO PREVENT FALLS:  Life alert? No  Use of a cane, walker or w/c? No  Grab bars in the bathroom? No  Shower chair or bench in shower? No  Elevated toilet seat or a handicapped toilet? Yes   TIMED UP AND GO:  Was the test performed? No .  Phone visit.  Cognitive Function:        07/07/2021    3:04 PM  12/26/2018   10:40 AM 12/22/2017   10:49 AM  6CIT Screen  What Year? 0 points 0 points 0 points  What month? 0 points 0 points 0 points  What time? 0 points 0 points 0 points  Count back from 20 0 points 0 points 0  points  Months in reverse 0 points 0 points 0 points  Repeat phrase 0 points 0 points 0 points  Total Score 0 points 0 points 0 points    Immunizations Immunization History  Administered Date(s) Administered   Fluad Quad(high Dose 65+) 10/25/2019, 11/04/2020   Influenza, High Dose Seasonal PF 10/20/2013, 11/03/2015, 10/01/2016, 10/25/2017, 09/25/2018   Moderna Sars-Covid-2 Vaccination 04/14/2019, 05/15/2019   Pneumococcal Conjugate-13 11/18/2013   Pneumococcal Polysaccharide-23 08/18/2015   Tdap 11/26/2010    TDAP status: Due, Education has been provided regarding the importance of this vaccine. Advised may receive this vaccine at local pharmacy or Health Dept. Aware to provide a copy of the vaccination record if obtained from local pharmacy or Health Dept. Verbalized acceptance and understanding.  Flu Vaccine status: Up to date  Pneumococcal vaccine status: Up to date  Covid-19 vaccine status: Information provided on how to obtain vaccines.   Qualifies for Shingles Vaccine? Yes   Zostavax completed No   Shingrix Completed?: No.    Education has been provided regarding the importance of this vaccine. Patient has been advised to call insurance company to determine out of pocket expense if they have not yet received this vaccine. Advised may also receive vaccine at local pharmacy or Health Dept. Verbalized acceptance and understanding.  Screening Tests Health Maintenance  Topic Date Due   Zoster Vaccines- Shingrix (1 of 2) Never done   COVID-19 Vaccine (3 - Booster for Moderna series) 07/10/2019   OPHTHALMOLOGY EXAM  12/09/2019   TETANUS/TDAP  11/25/2020   FOOT EXAM  05/05/2021   MAMMOGRAM  08/05/2021   INFLUENZA VACCINE  09/07/2021   HEMOGLOBIN A1C  12/01/2021   Pneumonia Vaccine 24+ Years old  Completed   DEXA SCAN  Completed   HPV VACCINES  Aged Out   Hepatitis C Screening  Discontinued   Fecal DNA (Cologuard)  Discontinued    Health Maintenance  Health Maintenance  Due  Topic Date Due   Zoster Vaccines- Shingrix (1 of 2) Never done   COVID-19 Vaccine (3 - Booster for Moderna series) 07/10/2019   OPHTHALMOLOGY EXAM  12/09/2019   TETANUS/TDAP  11/25/2020   FOOT EXAM  05/05/2021    Colorectal cancer screening: Type of screening: Cologuard. Completed 09/04/2018. Repeat every No repeat required. years  Mammogram status: Completed 08/05/2020. Repeat every year  Bone Density status: Completed 12/22/2017. Results reflect: Bone density results: NORMAL. Repeat every 2 years.  Lung Cancer Screening: (Low Dose CT Chest recommended if Age 23-80 years, 30 pack-year currently smoking OR have quit w/in 15years.) does not qualify.   Additional Screening:  Hepatitis C Screening: does not qualify;   Vision Screening: Recommended annual ophthalmology exams for early detection of glaucoma and other disorders of the eye. Is the patient up to date with their annual eye exam?  No  Who is the provider or what is the name of the office in which the patient attends annual eye exams? [facility] If pt is not established with a provider, would they like to be referred to a provider to establish care? No .   Dental Screening: Recommended annual dental exams for proper oral hygiene  Community Resource Referral / Chronic Care Management:  CRR required this visit?  No   CCM required this visit?  Yes      Plan:     I have personally reviewed and noted the following in the patient's chart:   Medical and social history Use of alcohol, tobacco or illicit drugs  Current medications and supplements including opioid prescriptions.  Functional ability and status Nutritional status Physical activity Advanced directives List of other physicians Hospitalizations, surgeries, and ER visits in previous 12 months Vitals Screenings to include cognitive, depression, and falls Referrals and appointments  In addition, I have reviewed and discussed with patient certain  preventive protocols, quality metrics, and best practice recommendations. A written personalized care plan for preventive services as well as general preventive health recommendations were provided to patient.     Chriss Driver, LPN   [id number]   Nurse Notes: Discussed CCM referral for diabetes management. Pt states, "I would like to get off of my insulin." Pt would like CCM referral. Message sent to Dr. Lajuana Ripple to order.

## 2021-07-07 NOTE — Telephone Encounter (Signed)
Pt came in -= this  was done = lisinopril

## 2021-07-07 NOTE — Telephone Encounter (Signed)
Absolutely, please place order and indicate she needs to see imbedded care management, Julie/ pharmacy for med assistance, etc.  She may already be a patient with [facility].  If she broke out with Rybelsus, she will break out with Ozempic, as this is just the injection version of the same medication

## 2021-07-07 NOTE — Telephone Encounter (Signed)
  Prescription Request  07/07/2021  Is this a "Controlled Substance" medicine? Bp medication pt does not know the name of rx  Have you seen your PCP in the last 2 weeks? no  If YES, route message to pool  -  If NO, patient needs to be scheduled for appointment.  What is the name of the medication or equipment? Bp rx  Have you contacted your pharmacy to request a refill? no   Which pharmacy would you like this sent to? [facility] mail order   Patient notified that their request is being sent to the clinical staff for review and that they should receive a response within 2 business days.

## 2021-07-07 NOTE — Progress Notes (Signed)
Assessment & Plan:  1. Acute pain of both knees Patient is very adamant that she needs a knee injection today.  Discussed this is not appropriate at this time as she has not tried other treatments, had any x-rays completed, and her diabetes is uncontrolled.  Recommended taking Tylenol 1,000 mg 3 times daily as well as applying Voltaren gel 4 times daily. Education provided on knee pain. - DG Knee 1-2 Views Left - DG Knee 1-2 Views Right - acetaminophen (TYLENOL) 500 MG tablet; Take 2 tablets (1,000 mg total) by mouth every 8 (eight) hours as needed.  Dispense: 90 tablet; Refill: 2  2. Uncontrolled type 2 diabetes mellitus with hyperglycemia ([place]) Strongly encouraged follow-up of her diabetes as currently it is not well managed. Her PCP referred her to endocrinology who she then refused to schedule an appointment with. After discussion of the risks of uncontrolled diabetes, she was agreeable in speaking with our clinical pharmacist tomorrow on the telephone.   Follow up plan: Return if symptoms worsen or fail to improve.  Hendricks Limes, MSN, APRN, FNP-C [facility]  Subjective:   Patient ID: Tami Villanueva, female    DOB: Aug 09, 1940, 81 y.o.   MRN: [id number]  HPI: Tami Villanueva is a 81 y.o. female presenting on 07/07/2021 for Knee Pain (Bilateral - L>R)  Patient presents with knee pain involving the  bilateral knee. Onset of the symptoms was  a couple months ago, but has worsened over the past few days . Inciting event: none known. Current symptoms include pain located on the sides of her knees . Pain is aggravated by standing.  Patient has had prior knee problems 20+ years ago. Evaluation to date: none. Treatment to date: rest.    ROS: Negative unless specifically indicated above in HPI.   Relevant past medical history reviewed and updated as indicated.   Allergies and medications reviewed and updated.   Current Outpatient Medications:    atorvastatin (LIPITOR)  20 MG tablet, TAKE 1 TABLET BY MOUTH ONCE DAILY, Disp: 90 tablet, Rfl: 0   Continuous Blood Gluc Receiver (FREESTYLE LIBRE READER) DEVI, 1 Units by Does not apply route daily. UAD to test BGs daily. Dx E11.40, Disp: 1 each, Rfl: 1   Continuous Blood Gluc Sensor (FREESTYLE LIBRE 14 DAY SENSOR) MISC, 1 Units by Does not apply route daily. UAD to check blood sugars R73.09, Disp: 2 each, Rfl: 12   glucose blood test strip, Test BGs up to 4 times daily E11.65 (one touch verio), Disp: 400 each, Rfl: 12   Insulin Glargine (BASAGLAR KWIKPEN) 100 UNIT/ML, Inject 22-36 Units into the skin daily., Disp: 45 mL, Rfl: 5   NYAMYC powder, APPLY 1 APPLICATION  TOPICALLY 3 TIMES DAILY FOR 7 TO 10 DAYS AS NEEDED FOR  BREAST RASH, Disp: 45 g, Rfl: 0   OneTouch Delica Lancets 25D MISC, Test blood sugar two times daily. Dx:  E11.65, Disp: 100 each, Rfl: 11   Semaglutide (RYBELSUS) 3 MG TABS, Take 3 mg by mouth daily., Disp: 30 tablet, Rfl: 0   lisinopril (ZESTRIL) 10 MG tablet, Take 1 tablet (10 mg total) by mouth daily., Disp: 90 tablet, Rfl: 1  Allergies  Allergen Reactions   Codeine Nausea And Vomiting   Iodine Hives   Metformin And Related     Lactic acidosis   Penicillins Other (See Comments)    Passed out   Sulfa Antibiotics Nausea And Vomiting    Objective:   BP 139/88  Pulse 80   Temp 97.7 F (36.5 C)   Resp 20   Ht '5\' 4"'$  (1.626 m)   Wt 167 lb (75.8 kg)   SpO2 96%   BMI 28.67 kg/m    Physical Exam Vitals reviewed.  Constitutional:      General: She is not in acute distress.    Appearance: Normal appearance. She is not ill-appearing, toxic-appearing or diaphoretic.  HENT:     Head: Normocephalic and atraumatic.  Eyes:     General: No scleral icterus.       Right eye: No discharge.        Left eye: No discharge.     Conjunctiva/sclera: Conjunctivae normal.  Cardiovascular:     Rate and Rhythm: Normal rate.  Pulmonary:     Effort: Pulmonary effort is normal. No respiratory distress.   Musculoskeletal:        General: Normal range of motion.     Cervical back: Normal range of motion.     Right knee: Tenderness present.     Left knee: Tenderness present.  Skin:    General: Skin is warm and dry.     Capillary Refill: Capillary refill takes less than 2 seconds.  Neurological:     General: No focal deficit present.     Mental Status: She is alert and oriented to person, place, and time. Mental status is at baseline.  Psychiatric:        Mood and Affect: Mood normal.        Behavior: Behavior normal.        Thought Content: Thought content normal.        Judgment: Judgment normal.

## 2021-07-07 NOTE — Telephone Encounter (Signed)
Pt called [facility] and told pt that application has not been updated in order to ship the basaglar. Pt says she has enough for a couple more days. Pt has an apt today and would like to get some samples. Please call back

## 2021-07-07 NOTE — Telephone Encounter (Signed)
During AWV pt states, "I would like to come off of my insulin." Discussed CCM referral for Diabetes Management. Pt is agreeable and would like order placed. Is this okay with you to order?  Pt also states she broke out in a rash when she tried the Rebelsus. Pt asks if she could try another medication? Mentioned Ozempic. Thank you.

## 2021-07-12 NOTE — Progress Notes (Signed)
Patient calling about results. Please call back and leave on voicemail if she does not answer [phone]

## 2021-07-13 ENCOUNTER — Ambulatory Visit (INDEPENDENT_AMBULATORY_CARE_PROVIDER_SITE_OTHER): Payer: Medicare Other | Admitting: Pharmacist

## 2021-07-13 DIAGNOSIS — E785 Hyperlipidemia, unspecified: Secondary | ICD-10-CM | POA: Diagnosis not present

## 2021-07-13 DIAGNOSIS — Z794 Long term (current) use of insulin: Secondary | ICD-10-CM | POA: Diagnosis not present

## 2021-07-13 DIAGNOSIS — E1165 Type 2 diabetes mellitus with hyperglycemia: Secondary | ICD-10-CM | POA: Diagnosis not present

## 2021-07-13 DIAGNOSIS — E1169 Type 2 diabetes mellitus with other specified complication: Secondary | ICD-10-CM | POA: Diagnosis not present

## 2021-07-13 MED ORDER — BASAGLAR KWIKPEN 100 UNIT/ML ~~LOC~~ SOPN: 30.0000 [IU] | PEN_INJECTOR | Freq: Every day | SUBCUTANEOUS | 5 refills | Status: DC

## 2021-07-13 MED ORDER — TRULICITY 0.75 MG/0.5ML ~~LOC~~ SOAJ: 0.7500 mg | SUBCUTANEOUS | 0 refills | Status: DC

## 2021-07-13 NOTE — Progress Notes (Signed)
    07/13/2021 Name: Tami Villanueva MRN: [id number] DOB: [birth year]   S:  78 yoF Presents for diabetes evaluation, education, and management Patient was referred and last seen by Primary Care Provider on 06/01/21.  Patient is still uncontrolled and needs medication assistance as well.    Insurance coverage/medication affordability: UHC medicare  Patient reports adherence with medications. Current diabetes medications include: basaglar    Patient denies hypoglycemic events.   O:  Lab Results  Component Value Date   HGBA1C 9.5 (H) 06/01/2021   Lipid Panel     Component Value Date/Time   CHOL 165 08/05/2020 0915   CHOL 260 (H) 06/12/2012 0909   TRIG 121 08/05/2020 0915   TRIG 191 (H) 04/18/2014 1004   TRIG 164 (H) 06/12/2012 0909   HDL 59 08/05/2020 0915   HDL 49 04/18/2014 1004   HDL 57 06/12/2012 0909   CHOLHDL 2.8 08/05/2020 0915   LDLCALC 85 08/05/2020 0915   LDLCALC 126 (H) 12/10/2012 0806   LDLCALC 170 (H) 06/12/2012 0909   LDLDIRECT 167 (H) 11/23/2018 1141     Home fasting blood sugars: <200  2 hour post-meal/random blood sugars:  n/a.    Clinical Atherosclerotic Cardiovascular Disease (ASCVD): No   The 10-year ASCVD risk score (Arnett DK, et al., 2019) is: 53.8%   Values used to calculate the score:     Age: 69 years     Sex: Female     Is Non-Hispanic African American: No     Diabetic: Yes     Tobacco smoker: No     Systolic Blood Pressure: 158 mmHg     Is BP treated: Yes     HDL Cholesterol: 59 mg/dL     Total Cholesterol: 165 mg/dL    A/P:  Diabetes T2DM currently uncontrolled.   -CONTINUE basaglar 25 units daily  -START truliicity 0.'75mg'$  sq weekly  Denies personal and family history of Medullary thyroid cancer (MTC)  Will titrate up to 1.'5mg'$  sq weekly as tolerated  Application submitted for [facility] patient assistance  -Extensively discussed pathophysiology of diabetes, recommended lifestyle interventions, dietary effects on blood sugar  control  -Counseled on s/sx of and management of hypoglycemia  -Next A1C anticipated 12/01/21.     Written patient instructions provided.  Total time in face to face counseling 25 minutes.    Regina Eck, PharmD, BCPS Clinical Pharmacist, [place]  II Phone [phone]

## 2021-07-15 ENCOUNTER — Telehealth: Payer: Self-pay

## 2021-07-15 NOTE — Telephone Encounter (Signed)
Received notification from [place] regarding approval for [facility]. Patient assistance approved from 07/14/21 to 02/06/22. -- MEDICATION WILL DELIVER TO PT'S HOME.  Received notification from [place] regarding patient assistance DENIAL for TRULICITY 0.'75MG'$ /0.5ML. -- COMPANY NOT CURRENTLY TAKING NEW APPLICATIONS FOR THIS MEDICATION.   Phone: [phone]

## 2021-07-16 DIAGNOSIS — E1165 Type 2 diabetes mellitus with hyperglycemia: Secondary | ICD-10-CM | POA: Diagnosis not present

## 2021-07-19 ENCOUNTER — Encounter: Payer: Self-pay | Admitting: *Deleted

## 2021-07-27 ENCOUNTER — Other Ambulatory Visit: Payer: Self-pay | Admitting: Family Medicine

## 2021-07-29 ENCOUNTER — Ambulatory Visit: Payer: Medicare Other | Admitting: Pharmacist

## 2021-08-12 ENCOUNTER — Ambulatory Visit (INDEPENDENT_AMBULATORY_CARE_PROVIDER_SITE_OTHER): Payer: Medicare Other | Admitting: Pharmacist

## 2021-08-12 DIAGNOSIS — E1169 Type 2 diabetes mellitus with other specified complication: Secondary | ICD-10-CM

## 2021-08-12 DIAGNOSIS — E1165 Type 2 diabetes mellitus with hyperglycemia: Secondary | ICD-10-CM

## 2021-08-12 DIAGNOSIS — E785 Hyperlipidemia, unspecified: Secondary | ICD-10-CM | POA: Diagnosis not present

## 2021-08-13 MED ORDER — BASAGLAR KWIKPEN 100 UNIT/ML ~~LOC~~ SOPN: 30.0000 [IU] | PEN_INJECTOR | Freq: Every day | SUBCUTANEOUS | 5 refills | Status: AC

## 2021-08-13 NOTE — Progress Notes (Signed)
      08/12/2021 Name: Tami Villanueva MRN: [id number]       DOB: 01/15/1941     S:  81 yoF Presents for diabetes evaluation, education, and management.  Patient was referred and last seen by Primary Care Provider on 07/07/21.  Patient's type 2 diabetes remains uncontrolled with an A1c on 9.5%.  Patient is also in need of medication assistance due to medicare coverage gap      Insurance coverage/medication affordability: [place]   Patient reports adherence with medications. Current diabetes medications include: basaglar Current blood pressure meds: lisinopril Current HLD meds: atorvastatin     Patient denies hypoglycemic events.   O:   Recent Labs       Lab Results  Component Value Date    HGBA1C 9.5 (H) 06/01/2021      Lipid Panel   Labs (Brief)          Component Value Date/Time    CHOL 165 08/05/2020 0915    CHOL 260 (H) 06/12/2012 0909    TRIG 121 08/05/2020 0915    TRIG 191 (H) 04/18/2014 1004    TRIG 164 (H) 06/12/2012 0909    HDL 59 08/05/2020 0915    HDL 49 04/18/2014 1004    HDL 57 06/12/2012 0909    CHOLHDL 2.8 08/05/2020 0915    LDLCALC 85 08/05/2020 0915    LDLCALC 126 (H) 12/10/2012 0806    LDLCALC 170 (H) 06/12/2012 0909    LDLDIRECT 167 (H) 11/23/2018 1141         Home fasting blood sugars: <200   2 hour post-meal/random blood sugars:  n/a.     Clinical Atherosclerotic Cardiovascular Disease (ASCVD): No    The 10-year ASCVD risk score (Arnett DK, et al., 2019) is: 53.8%   Values used to calculate the score:     Age: 81 years     Sex: Female     Is Non-Hispanic African American: No     Diabetic: Yes     Tobacco smoker: No     Systolic Blood Pressure: 492 mmHg     Is BP treated: Yes     HDL Cholesterol: 59 mg/dL     Total Cholesterol: 165 mg/dL     A/P:   Diabetes T2DM currently uncontrolled-patient reports blood glucose improving.    -CONTINUE Basaglar 25-30 units daily  Getting via [employer] patient assistance (delivers to  patient's home)   [facility] in [place] start Trulicity due to [employer] patient assistance program pause  -Will apply to get Ozempic patient assistance via novo nordisk patient assistance program 0.'25mg'$  sq weekly for 4 weeks, then 0.'5mg'$  weekly x4 weeks, then '1mg'$  sq weekly thereafter             Denies personal and family history of Medullary thyroid cancer ([place])          -Extensively discussed pathophysiology of diabetes, recommended lifestyle interventions, dietary effects on blood sugar control   -Counseled on s/sx of and management of hypoglycemia   -Next A1C anticipated 12/01/21.       Written patient instructions provided.  Total time in counseling 25 minutes.      Regina Eck, PharmD, BCPS Clinical Pharmacist, [place]  II Phone [phone]

## 2021-08-16 DIAGNOSIS — E1165 Type 2 diabetes mellitus with hyperglycemia: Secondary | ICD-10-CM | POA: Diagnosis not present

## 2021-08-19 ENCOUNTER — Telehealth: Payer: Medicare Other

## 2021-08-24 DIAGNOSIS — M1712 Unilateral primary osteoarthritis, left knee: Secondary | ICD-10-CM | POA: Diagnosis not present

## 2021-08-30 ENCOUNTER — Telehealth: Payer: Self-pay | Admitting: Family Medicine

## 2021-08-30 NOTE — Telephone Encounter (Signed)
Pt wants to let Almyra Free know that she still has not received ozempic and BASAGLAR from the [employer]. The last shipment she got it was from June. Pt does not have the phone to call.   Please call back

## 2021-08-31 DIAGNOSIS — E1159 Type 2 diabetes mellitus with other circulatory complications: Secondary | ICD-10-CM | POA: Diagnosis not present

## 2021-08-31 DIAGNOSIS — E1165 Type 2 diabetes mellitus with hyperglycemia: Secondary | ICD-10-CM | POA: Diagnosis not present

## 2021-08-31 DIAGNOSIS — E1169 Type 2 diabetes mellitus with other specified complication: Secondary | ICD-10-CM | POA: Diagnosis not present

## 2021-08-31 DIAGNOSIS — I152 Hypertension secondary to endocrine disorders: Secondary | ICD-10-CM | POA: Diagnosis not present

## 2021-08-31 DIAGNOSIS — E785 Hyperlipidemia, unspecified: Secondary | ICD-10-CM | POA: Diagnosis not present

## 2021-08-31 DIAGNOSIS — Z794 Long term (current) use of insulin: Secondary | ICD-10-CM | POA: Diagnosis not present

## 2021-08-31 DIAGNOSIS — E119 Type 2 diabetes mellitus without complications: Secondary | ICD-10-CM | POA: Diagnosis not present

## 2021-08-31 NOTE — Telephone Encounter (Signed)
Hi!  Can you please f/u with patient to ensure apps have been completed correctly?  Thank you! I can try to get her samples if needed

## 2021-09-01 NOTE — Telephone Encounter (Signed)
Left call back number with pt's spouse (?). Gave direct line to call back regarding proof of income needed for novo nordisk application.  (Need updated income for 2023. Can use 1040 tax form, provide paystubs, or ssi statement if applicable).

## 2021-09-02 ENCOUNTER — Telehealth: Payer: Self-pay

## 2021-09-02 NOTE — Telephone Encounter (Signed)
Spoke with pt regarding Ozempic application with [employer]. Company needs updated/corrected proof of income.   Pt will email proof to me, or stop by office Friday afternoon to drop off.  Pt also had previous issue with basaglar shipment. Says she went out of town and her basaglar was delivered but sat outside too long. [employer] told her to not take medication. She spoke with someone at office but never rec'd a return call of what to do. I gave her [facility] specialty pharmacys direct number to speak with them & request another shipment.

## 2021-09-10 ENCOUNTER — Other Ambulatory Visit: Payer: Self-pay | Admitting: Family Medicine

## 2021-09-10 DIAGNOSIS — I10 Essential (primary) hypertension: Secondary | ICD-10-CM

## 2021-09-12 IMAGING — MG MM DIGITAL SCREENING BILAT W/ TOMO AND CAD
8 series · 8 of 24 positions shown · non-contrast
Comparison: Previous exam(s).

CLINICAL DATA: Screening.

EXAM:
DIGITAL SCREENING BILATERAL MAMMOGRAM WITH TOMOSYNTHESIS AND CAD
TECHNIQUE: Bilateral screening digital craniocaudal and mediolateral oblique
mammograms were obtained. Bilateral screening digital breast
tomosynthesis was performed. The images were evaluated with
computer-aided detection.

[L CC synth-2D]
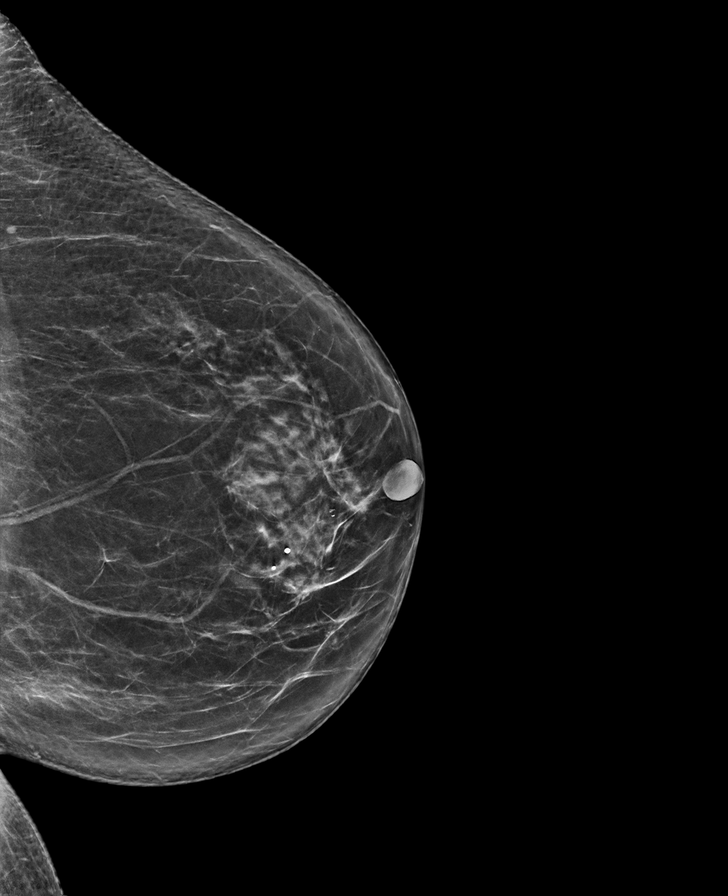

[L MLO synth-2D]
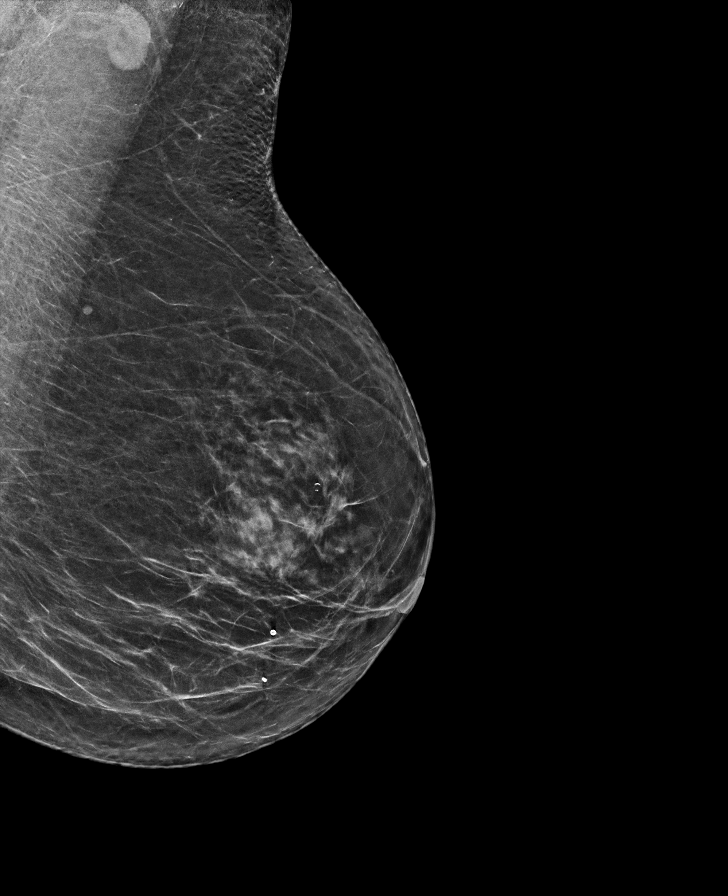

[R MLO synth-2D]
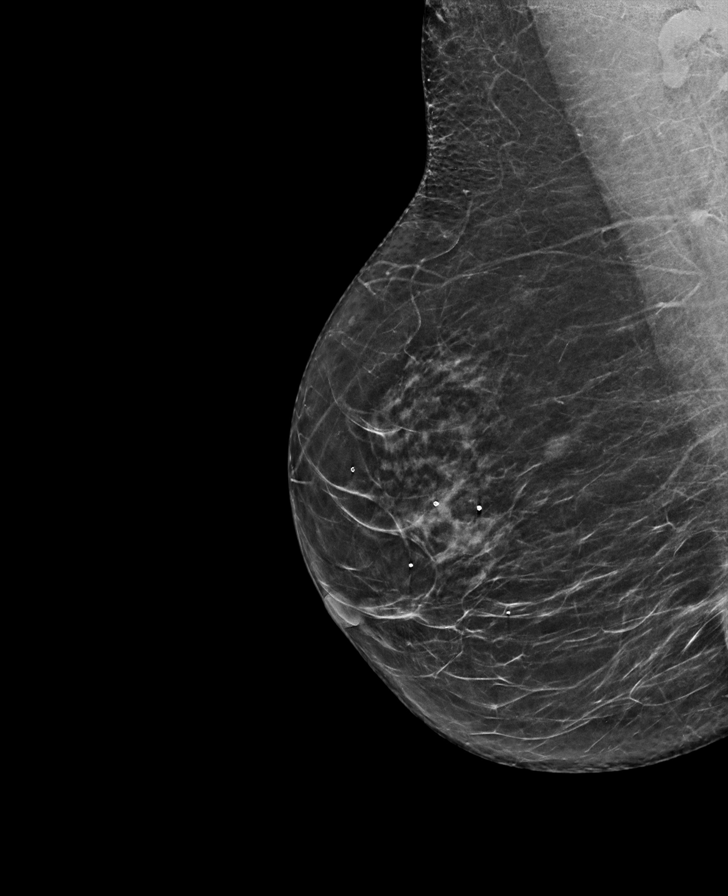

[R CC synth-2D]
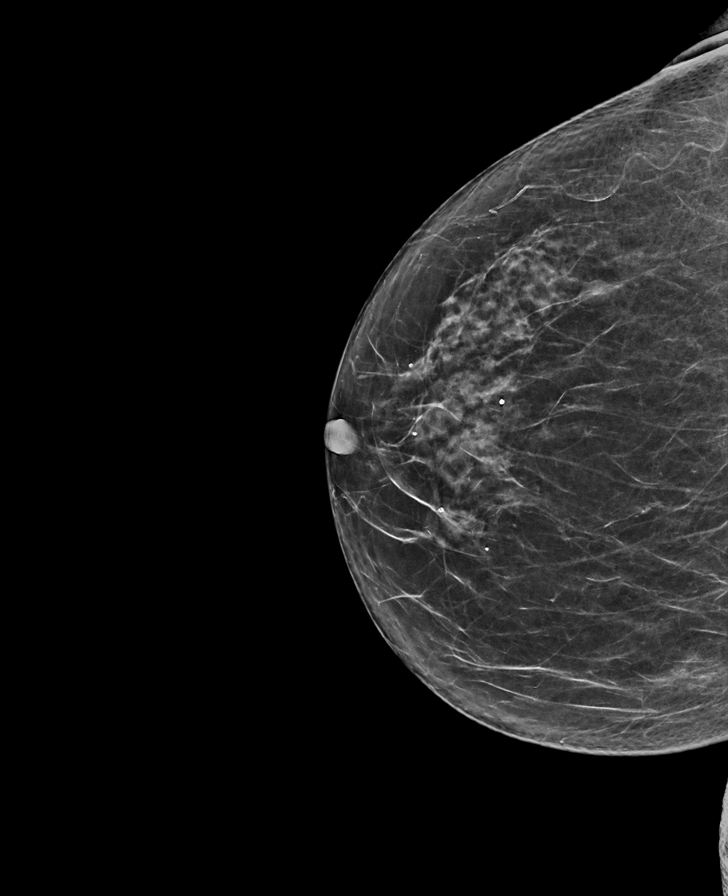

[R MLO tomo · tomo slice 35/69.0]
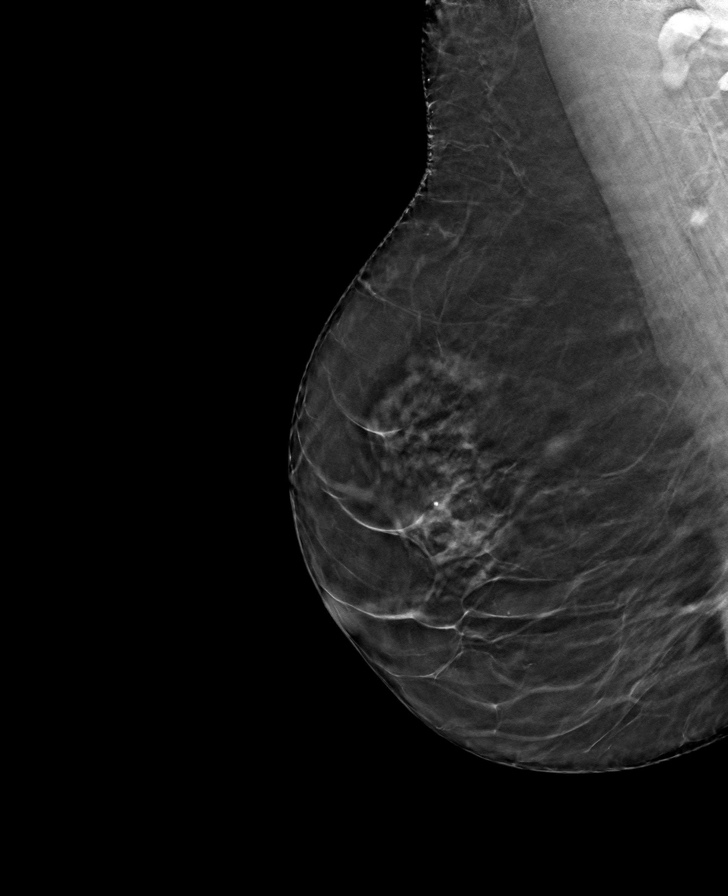

[R CC tomo · tomo slice 35/68.0]
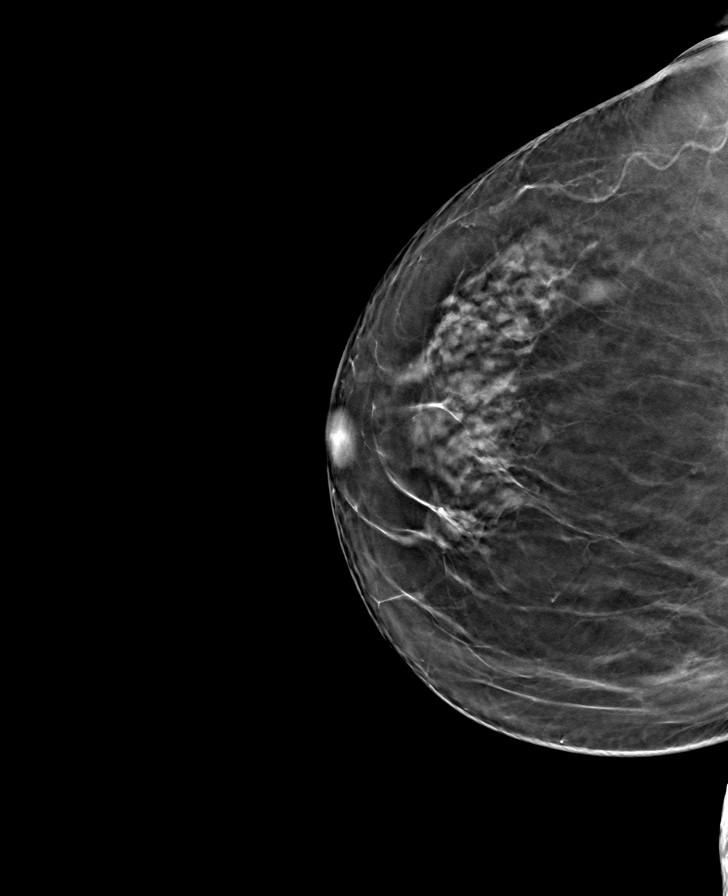

[L MLO tomo · tomo slice 35/68.0]
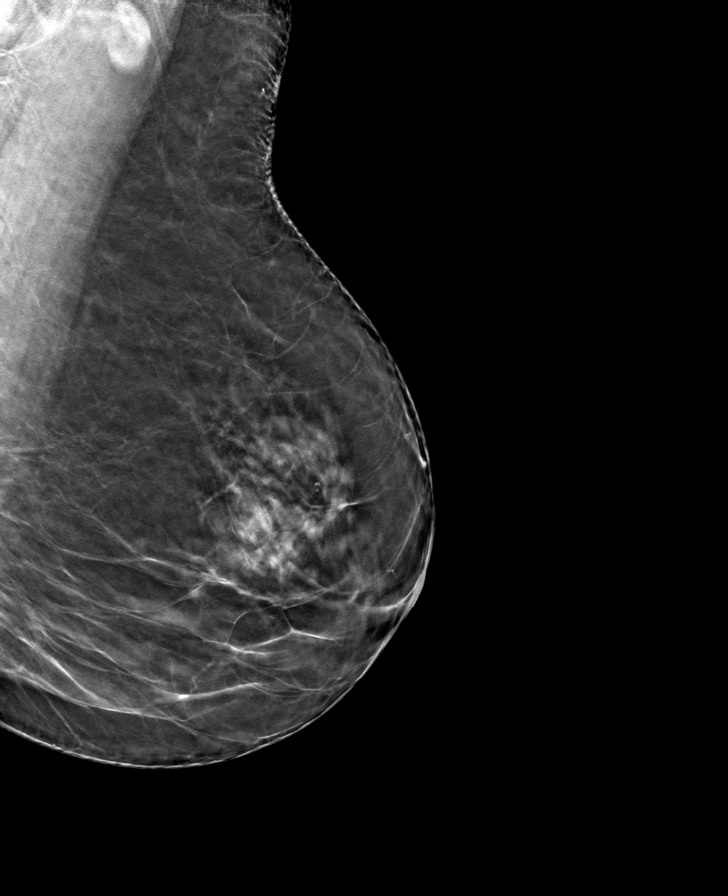

[L CC tomo · tomo slice 35/69.0]
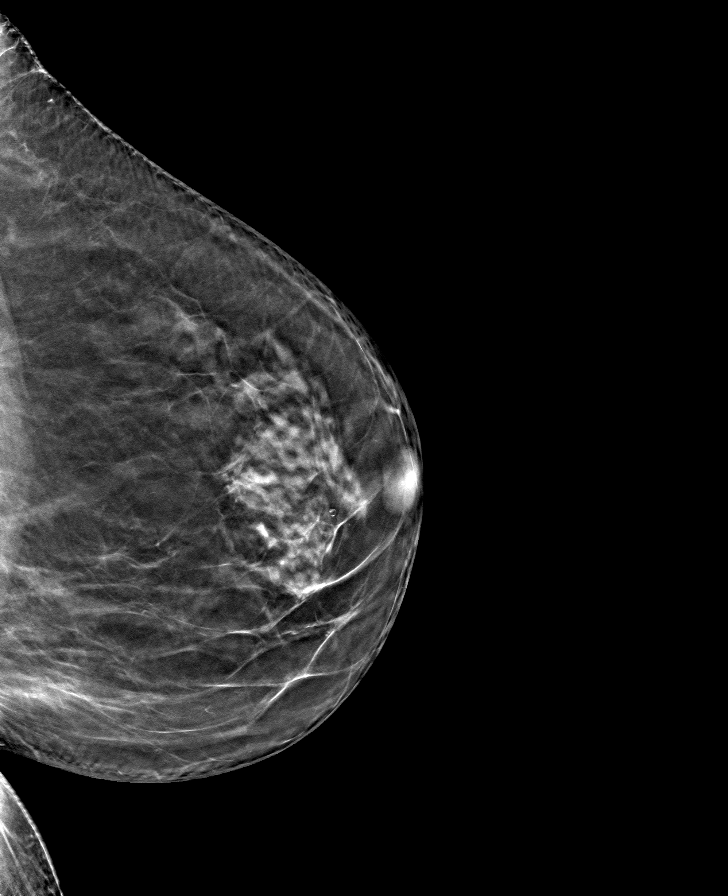

[8 of 24 positions shown; findings below may reference images not displayed]

ACR Breast Density Category b: There are scattered areas of
fibroglandular density.
FINDINGS: There are no findings suspicious for malignancy.
IMPRESSION: No mammographic evidence of malignancy. A result letter of this
screening mammogram will be mailed directly to the patient.

RECOMMENDATION:
Screening mammogram in one year. (Code:51-O-LD2)

BI-RADS CATEGORY  1: Negative.

## 2021-09-16 DIAGNOSIS — E1165 Type 2 diabetes mellitus with hyperglycemia: Secondary | ICD-10-CM | POA: Diagnosis not present

## 2021-10-01 NOTE — Telephone Encounter (Signed)
Left message informing pt her novo nordisk application was denied due to income. Let patient know her pcp or Almyra Free will follow up with her on next medication steps.

## 2021-10-30 ENCOUNTER — Other Ambulatory Visit: Payer: Self-pay | Admitting: Family Medicine

## 2021-12-01 ENCOUNTER — Ambulatory Visit: Payer: Medicare Other | Admitting: Family Medicine

## 2021-12-06 DIAGNOSIS — E1159 Type 2 diabetes mellitus with other circulatory complications: Secondary | ICD-10-CM | POA: Diagnosis not present

## 2021-12-06 DIAGNOSIS — M1712 Unilateral primary osteoarthritis, left knee: Secondary | ICD-10-CM | POA: Diagnosis not present

## 2021-12-06 DIAGNOSIS — E1165 Type 2 diabetes mellitus with hyperglycemia: Secondary | ICD-10-CM | POA: Diagnosis not present

## 2021-12-06 DIAGNOSIS — I152 Hypertension secondary to endocrine disorders: Secondary | ICD-10-CM | POA: Diagnosis not present

## 2021-12-06 DIAGNOSIS — Z794 Long term (current) use of insulin: Secondary | ICD-10-CM | POA: Diagnosis not present

## 2022-01-08 ENCOUNTER — Other Ambulatory Visit: Payer: Self-pay | Admitting: Family Medicine

## 2022-01-20 DIAGNOSIS — E1165 Type 2 diabetes mellitus with hyperglycemia: Secondary | ICD-10-CM | POA: Diagnosis not present

## 2022-02-02 ENCOUNTER — Ambulatory Visit: Payer: Medicare Other | Admitting: Family Medicine

## 2022-03-19 ENCOUNTER — Other Ambulatory Visit: Payer: Self-pay | Admitting: Family Medicine

## 2022-03-21 ENCOUNTER — Encounter: Payer: Self-pay | Admitting: Family Medicine

## 2022-03-21 NOTE — Telephone Encounter (Signed)
[employer] last OV 05/2021 RF NOT sent to mail order

## 2022-03-21 NOTE — Telephone Encounter (Signed)
LMTCB TO SCHEDULE APPT LETTER MAILED

## 2022-04-25 DIAGNOSIS — E1165 Type 2 diabetes mellitus with hyperglycemia: Secondary | ICD-10-CM | POA: Diagnosis not present

## 2022-07-21 DIAGNOSIS — Z1211 Encounter for screening for malignant neoplasm of colon: Secondary | ICD-10-CM | POA: Diagnosis not present

## 2022-09-12 ENCOUNTER — Ambulatory Visit: Admission: RE | Admit: 2022-09-12 | Payer: Medicare Other | Source: Ambulatory Visit

## 2022-09-12 ENCOUNTER — Other Ambulatory Visit: Payer: Self-pay | Admitting: Family Medicine

## 2022-09-12 DIAGNOSIS — Z1231 Encounter for screening mammogram for malignant neoplasm of breast: Secondary | ICD-10-CM

## 2022-11-07 DIAGNOSIS — Z780 Asymptomatic menopausal state: Secondary | ICD-10-CM | POA: Diagnosis not present

## 2022-11-07 DIAGNOSIS — I152 Hypertension secondary to endocrine disorders: Secondary | ICD-10-CM | POA: Diagnosis not present

## 2022-11-07 DIAGNOSIS — Z13820 Encounter for screening for osteoporosis: Secondary | ICD-10-CM | POA: Diagnosis not present

## 2022-11-07 DIAGNOSIS — Z794 Long term (current) use of insulin: Secondary | ICD-10-CM | POA: Diagnosis not present

## 2022-11-07 DIAGNOSIS — R790 Abnormal level of blood mineral: Secondary | ICD-10-CM | POA: Diagnosis not present

## 2022-11-07 DIAGNOSIS — E785 Hyperlipidemia, unspecified: Secondary | ICD-10-CM | POA: Diagnosis not present

## 2022-11-07 DIAGNOSIS — E1165 Type 2 diabetes mellitus with hyperglycemia: Secondary | ICD-10-CM | POA: Diagnosis not present

## 2022-11-07 DIAGNOSIS — B379 Candidiasis, unspecified: Secondary | ICD-10-CM | POA: Diagnosis not present

## 2022-11-07 DIAGNOSIS — E1159 Type 2 diabetes mellitus with other circulatory complications: Secondary | ICD-10-CM | POA: Diagnosis not present

## 2022-11-07 DIAGNOSIS — Z0000 Encounter for general adult medical examination without abnormal findings: Secondary | ICD-10-CM | POA: Diagnosis not present

## 2022-11-07 DIAGNOSIS — E1169 Type 2 diabetes mellitus with other specified complication: Secondary | ICD-10-CM | POA: Diagnosis not present

## 2022-11-07 DIAGNOSIS — E119 Type 2 diabetes mellitus without complications: Secondary | ICD-10-CM | POA: Diagnosis not present

## 2022-11-30 DIAGNOSIS — H25813 Combined forms of age-related cataract, bilateral: Secondary | ICD-10-CM | POA: Diagnosis not present

## 2022-11-30 DIAGNOSIS — E119 Type 2 diabetes mellitus without complications: Secondary | ICD-10-CM | POA: Diagnosis not present
